# Patient Record
Sex: Male | Born: 1955 | Race: White | Hispanic: No
Health system: Southern US, Community
[De-identification: ages and names within clinical notes are randomized; demographics above are authoritative.]

---

## 2018-10-29 ENCOUNTER — Other Ambulatory Visit: Payer: Self-pay

## 2018-10-29 ENCOUNTER — Inpatient Hospital Stay (HOSPITAL_COMMUNITY)
Admission: AD | Admit: 2018-10-29 | Discharge: 2018-11-03 | DRG: 064 | Disposition: A | Discharge status: Home Health | Payer: Self-pay | Source: Other Acute Inpatient Hospital | Attending: Pulmonary Disease | Admitting: Pulmonary Disease

## 2018-10-29 ENCOUNTER — Inpatient Hospital Stay (HOSPITAL_COMMUNITY): Payer: Self-pay

## 2018-10-29 ENCOUNTER — Encounter (HOSPITAL_COMMUNITY): Payer: Self-pay | Admitting: Pulmonary Disease

## 2018-10-29 DIAGNOSIS — R270 Ataxia, unspecified: Secondary | ICD-10-CM | POA: Diagnosis present

## 2018-10-29 DIAGNOSIS — R740 Nonspecific elevation of levels of transaminase and lactic acid dehydrogenase [LDH]: Secondary | ICD-10-CM | POA: Diagnosis present

## 2018-10-29 DIAGNOSIS — X31XXXA Exposure to excessive natural cold, initial encounter: Secondary | ICD-10-CM

## 2018-10-29 DIAGNOSIS — R6521 Severe sepsis with septic shock: Secondary | ICD-10-CM | POA: Diagnosis present

## 2018-10-29 DIAGNOSIS — N179 Acute kidney failure, unspecified: Secondary | ICD-10-CM | POA: Diagnosis present

## 2018-10-29 DIAGNOSIS — I639 Cerebral infarction, unspecified: Secondary | ICD-10-CM | POA: Diagnosis present

## 2018-10-29 DIAGNOSIS — A419 Sepsis, unspecified organism: Secondary | ICD-10-CM | POA: Diagnosis present

## 2018-10-29 DIAGNOSIS — J189 Pneumonia, unspecified organism: Secondary | ICD-10-CM | POA: Diagnosis present

## 2018-10-29 DIAGNOSIS — G9349 Other encephalopathy: Secondary | ICD-10-CM | POA: Diagnosis present

## 2018-10-29 DIAGNOSIS — I6340 Cerebral infarction due to embolism of unspecified cerebral artery: Principal | ICD-10-CM | POA: Diagnosis present

## 2018-10-29 DIAGNOSIS — E43 Unspecified severe protein-calorie malnutrition: Secondary | ICD-10-CM

## 2018-10-29 DIAGNOSIS — J9601 Acute respiratory failure with hypoxia: Secondary | ICD-10-CM | POA: Diagnosis present

## 2018-10-29 DIAGNOSIS — E872 Acidosis: Secondary | ICD-10-CM | POA: Diagnosis present

## 2018-10-29 DIAGNOSIS — I63413 Cerebral infarction due to embolism of bilateral middle cerebral arteries: Secondary | ICD-10-CM

## 2018-10-29 DIAGNOSIS — T68XXXA Hypothermia, initial encounter: Secondary | ICD-10-CM | POA: Diagnosis present

## 2018-10-29 DIAGNOSIS — F1410 Cocaine abuse, uncomplicated: Secondary | ICD-10-CM | POA: Diagnosis present

## 2018-10-29 DIAGNOSIS — I4891 Unspecified atrial fibrillation: Secondary | ICD-10-CM | POA: Diagnosis present

## 2018-10-29 DIAGNOSIS — R569 Unspecified convulsions: Secondary | ICD-10-CM | POA: Diagnosis present

## 2018-10-29 LAB — RAPID URINE DRUG SCREEN, HOSP PERFORMED
Amphetamines: NOT DETECTED
BENZODIAZEPINES: POSITIVE — AB
Barbiturates: NOT DETECTED
COCAINE: POSITIVE — AB
Opiates: NOT DETECTED
Tetrahydrocannabinol: NOT DETECTED

## 2018-10-29 LAB — CBC WITH DIFFERENTIAL/PLATELET
BASOS ABS: 0 10*3/uL (ref 0.0–0.1)
Basophils Relative: 0 %
EOS ABS: 0 10*3/uL (ref 0.0–0.5)
EOS PCT: 0 %
HCT: 46.6 % (ref 39.0–52.0)
Hemoglobin: 14.2 g/dL (ref 13.0–17.0)
Lymphocytes Relative: 3 %
Lymphs Abs: 0.9 10*3/uL (ref 0.7–4.0)
MCH: 30.1 pg (ref 26.0–34.0)
MCHC: 30.5 g/dL (ref 30.0–36.0)
MCV: 98.9 fL (ref 80.0–100.0)
Monocytes Absolute: 2 10*3/uL — ABNORMAL HIGH (ref 0.1–1.0)
Monocytes Relative: 6 %
NRBC: 0 % (ref 0.0–0.2)
Neutro Abs: 27.8 10*3/uL — ABNORMAL HIGH (ref 1.7–7.7)
Neutrophils Relative %: 90 %
Platelets: 331 10*3/uL (ref 150–400)
RBC: 4.71 MIL/uL (ref 4.22–5.81)
RDW: 13.1 % (ref 11.5–15.5)
WBC: 31 10*3/uL — AB (ref 4.0–10.5)
nRBC: 0 /100 WBC

## 2018-10-29 LAB — COMPREHENSIVE METABOLIC PANEL
ALBUMIN: 3 g/dL — AB (ref 3.5–5.0)
ALT: 62 U/L — ABNORMAL HIGH (ref 0–44)
ANION GAP: 7 (ref 5–15)
AST: 163 U/L — ABNORMAL HIGH (ref 15–41)
Alkaline Phosphatase: 70 U/L (ref 38–126)
BILIRUBIN TOTAL: 0.6 mg/dL (ref 0.3–1.2)
BUN: 17 mg/dL (ref 8–23)
CO2: 22 mmol/L (ref 22–32)
Calcium: 7.5 mg/dL — ABNORMAL LOW (ref 8.9–10.3)
Chloride: 113 mmol/L — ABNORMAL HIGH (ref 98–111)
Creatinine, Ser: 1.35 mg/dL — ABNORMAL HIGH (ref 0.61–1.24)
GFR calc non Af Amer: 54 mL/min — ABNORMAL LOW (ref >60)
Glucose, Bld: 98 mg/dL (ref 70–99)
Potassium: 3.8 mmol/L (ref 3.5–5.1)
SODIUM: 142 mmol/L (ref 135–145)
Total Protein: 6.3 g/dL — ABNORMAL LOW (ref 6.5–8.1)

## 2018-10-29 LAB — GLUCOSE, CAPILLARY
GLUCOSE-CAPILLARY: 82 mg/dL (ref 70–99)
GLUCOSE-CAPILLARY: 77 mg/dL (ref 70–99)
Glucose-Capillary: 64 mg/dL — ABNORMAL LOW (ref 70–99)
Glucose-Capillary: 113 mg/dL — ABNORMAL HIGH (ref 70–99)
Glucose-Capillary: 106 mg/dL — ABNORMAL HIGH (ref 70–99)

## 2018-10-29 LAB — POCT I-STAT 3, ART BLOOD GAS (G3+)
Acid-base deficit: 9 mmol/L — ABNORMAL HIGH (ref 0.0–2.0)
Bicarbonate: 17.2 mmol/L — ABNORMAL LOW (ref 20.0–28.0)
O2 Saturation: 97 %
PCO2 ART: 37.9 mmHg (ref 32.0–48.0)
TCO2: 18 mmol/L — ABNORMAL LOW (ref 22–32)
pH, Arterial: 7.262 — ABNORMAL LOW (ref 7.350–7.450)
pO2, Arterial: 99 mmHg (ref 83.0–108.0)

## 2018-10-29 LAB — MRSA PCR SCREENING: MRSA BY PCR: POSITIVE — AB

## 2018-10-29 LAB — LACTIC ACID, PLASMA: LACTIC ACID, VENOUS: 3 mmol/L — AB (ref 0.5–1.9)

## 2018-10-29 LAB — MAGNESIUM: MAGNESIUM: 1.6 mg/dL — AB (ref 1.7–2.4)

## 2018-10-29 LAB — PHOSPHORUS: Phosphorus: 1.7 mg/dL — ABNORMAL LOW (ref 2.5–4.6)

## 2018-10-29 LAB — CK: Total CK: 6276 U/L — ABNORMAL HIGH (ref 49–397)

## 2018-10-29 MED ORDER — FENTANYL CITRATE (PF) 100 MCG/2ML IJ SOLN
100.0000 ug | INTRAMUSCULAR | Status: DC | PRN
  Administered 2018-10-30 – 2018-10-31 (×7): 100 ug via INTRAVENOUS
  Filled 2018-10-29 (×7): qty 2

## 2018-10-29 MED ORDER — SODIUM CHLORIDE 0.9 % IV SOLN
2.0000 g | INTRAVENOUS | Status: DC
  Administered 2018-10-29 – 2018-10-30 (×2): 2 g via INTRAVENOUS
  Filled 2018-10-29 (×3): qty 20

## 2018-10-29 MED ORDER — IOPAMIDOL (ISOVUE-370) INJECTION 76%
50.0000 mL | Freq: Once | INTRAVENOUS | Status: AC | PRN
  Administered 2018-10-29: 50 mL via INTRAVENOUS

## 2018-10-29 MED ORDER — SODIUM CHLORIDE 0.9 % IV SOLN
1.0000 mg | Freq: Every day | INTRAVENOUS | Status: DC
  Administered 2018-10-29 – 2018-10-30 (×2): 1 mg via INTRAVENOUS
  Filled 2018-10-29 (×4): qty 0.2

## 2018-10-29 MED ORDER — DOCUSATE SODIUM 50 MG/5ML PO LIQD: 100.0000 mg | Freq: Two times a day (BID) | ORAL | Status: DC | PRN

## 2018-10-29 MED ORDER — NOREPINEPHRINE 4 MG/250ML-% IV SOLN
5.0000 ug/min | INTRAVENOUS | Status: DC
  Administered 2018-10-29: 5 ug/min via INTRAVENOUS
  Administered 2018-10-29: 9 ug/min via INTRAVENOUS
  Administered 2018-10-30: 5 ug/min via INTRAVENOUS
  Administered 2018-10-31: 1.5 ug/min via INTRAVENOUS
  Filled 2018-10-29 (×4): qty 250

## 2018-10-29 MED ORDER — FAMOTIDINE IN NACL 20-0.9 MG/50ML-% IV SOLN
20.0000 mg | Freq: Two times a day (BID) | INTRAVENOUS | Status: DC
  Administered 2018-10-29 – 2018-10-30 (×3): 20 mg via INTRAVENOUS
  Filled 2018-10-29 (×3): qty 50

## 2018-10-29 MED ORDER — MIDAZOLAM HCL 2 MG/2ML IJ SOLN
2.0000 mg | INTRAMUSCULAR | Status: DC | PRN
  Administered 2018-10-31: 2 mg via INTRAVENOUS
  Filled 2018-10-29: qty 2

## 2018-10-29 MED ORDER — MIDAZOLAM HCL 2 MG/2ML IJ SOLN
2.0000 mg | INTRAMUSCULAR | Status: DC | PRN
  Administered 2018-10-29 – 2018-10-31 (×5): 2 mg via INTRAVENOUS
  Filled 2018-10-29 (×5): qty 2

## 2018-10-29 MED ORDER — THIAMINE HCL 100 MG/ML IJ SOLN
100.0000 mg | Freq: Every day | INTRAMUSCULAR | Status: AC
  Administered 2018-10-29 – 2018-10-31 (×3): 100 mg via INTRAVENOUS
  Filled 2018-10-29 (×3): qty 2

## 2018-10-29 MED ORDER — ORAL CARE MOUTH RINSE
15.0000 mL | OROMUCOSAL | Status: DC
  Administered 2018-10-29 – 2018-10-31 (×17): 15 mL via OROMUCOSAL

## 2018-10-29 MED ORDER — SODIUM CHLORIDE 0.9 % IV SOLN
250.0000 mL | INTRAVENOUS | Status: DC
  Administered 2018-10-29: 250 mL via INTRAVENOUS

## 2018-10-29 MED ORDER — IPRATROPIUM-ALBUTEROL 0.5-2.5 (3) MG/3ML IN SOLN
3.0000 mL | Freq: Four times a day (QID) | RESPIRATORY_TRACT | Status: DC
  Administered 2018-10-29 – 2018-10-31 (×10): 3 mL via RESPIRATORY_TRACT
  Filled 2018-10-29 (×9): qty 3

## 2018-10-29 MED ORDER — DEXTROSE 50 % IV SOLN
25.0000 mL | Freq: Once | INTRAVENOUS | Status: AC
  Administered 2018-10-29: 25 mL via INTRAVENOUS
  Filled 2018-10-29: qty 50

## 2018-10-29 MED ORDER — INSULIN ASPART 100 UNIT/ML ~~LOC~~ SOLN
0.0000 [IU] | SUBCUTANEOUS | Status: DC
  Administered 2018-11-01 – 2018-11-02 (×2): 1 [IU] via SUBCUTANEOUS

## 2018-10-29 MED ORDER — DEXMEDETOMIDINE HCL IN NACL 200 MCG/50ML IV SOLN
0.0000 ug/kg/h | INTRAVENOUS | Status: DC
  Administered 2018-10-29 (×3): 0.4 ug/kg/h via INTRAVENOUS
  Administered 2018-10-30: 0.6 ug/kg/h via INTRAVENOUS
  Administered 2018-10-30: 0.602 ug/kg/h via INTRAVENOUS
  Administered 2018-10-30 – 2018-10-31 (×2): 0.6 ug/kg/h via INTRAVENOUS
  Administered 2018-10-31: 0.7 ug/kg/h via INTRAVENOUS
  Filled 2018-10-29 (×8): qty 50

## 2018-10-29 MED ORDER — FENTANYL CITRATE (PF) 100 MCG/2ML IJ SOLN: 100.0000 ug | INTRAMUSCULAR | Status: DC | PRN

## 2018-10-29 MED ORDER — CHLORHEXIDINE GLUCONATE 0.12% ORAL RINSE (MEDLINE KIT)
15.0000 mL | Freq: Two times a day (BID) | OROMUCOSAL | Status: DC
  Administered 2018-10-29 – 2018-11-02 (×9): 15 mL via OROMUCOSAL

## 2018-10-29 MED ORDER — ONDANSETRON HCL 4 MG/2ML IJ SOLN: 4.0000 mg | Freq: Four times a day (QID) | INTRAMUSCULAR | Status: DC | PRN

## 2018-10-29 NOTE — Progress Notes (Signed)
Spoke with pt's brotherSuella Broad- Lannie Blough. Brother reports pt is divorced and has a grown daughter that he has no contact with/no way to get in touch with. Brothers nu [phone].

## 2018-10-29 NOTE — H&P (Signed)
NAME:  Cory Golden, MRN:  [id number], DOB:  02/09/1956, LOS: 0 ADMISSION DATE:  10/29/2018, CONSULTATION DATE:  10/29/2018 REFERRING MD:  Cory Golden hospital EDP, CHIEF COMPLAINT:  Found down   Brief History   62 year old male found down at home on October 29, 2018, hypothermic, found to have evidence of an acute right cerebellar stroke.  History of present illness   62 year old male with a history of cocaine abuse was found down around 2 AM on October 29, 2018 in his RV.  His core temperature by EMS was 85 degrees and there was concern for seizure activity.  [facility] was [facility] and intubated for airway protection.  There he received warm saline, warm blankets.  His EKG showed evidence of atrial fibrillation, a chest x-ray showed an infiltrate in the left lung, and a head CT showed an acute to subacute right cerebellar stroke.  No family was available to provide further history.  He required levo fed to maintain a normal blood pressure, he received more than 5 L of saline at [facility].  He was transferred to our facility for further management.  Upon arrival here he was sedated on the ventilator and could not provide a history so history was obtained by chart review.  Past Medical History  Cocaine abuse  Significant Hospital Events     Consults:  November 17 neurology  Procedures:  November 17 endotracheal tube November 17 right internal jugular central venous catheter [facility] EDP placed)  Significant Diagnostic Tests:  November 17 CT head showed acute to subacute right cerebellar stroke   Micro Data:  November 17 respiratory culture  Antimicrobials:  November 17 ceftriaxone  Interim history/subjective:  As above  Objective   Height 6' (1.829 m), SpO2 100 %.    Vent Mode: PRVC FiO2 (%):  [30 %] 30 % Set Rate:  [26 bmp] 26 bmp Vt Set:  [520 mL] 520 mL PEEP:  [5 cmH20] 5 cmH20 Plateau Pressure:  [14 cmH20] 14 cmH20   Intake/Output Summary  (Last 24 hours) at 10/29/2018 1034 Last data filed at 10/29/2018 1005 Gross per 24 hour  Intake -  Output 1300 ml  Net -1300 ml   There were no vitals filed for this visit.  Examination:  General:  In bed on vent HENT: NCAT ETT in place PULM: CTA B, vent supported breathing CV: RRR, no mgr GI: BS+, soft, nontender MSK: normal bulk and tone Neuro: sedated on vent  Labs from [facility] reviewed: Kidney functions normal, anion gap is normal, profound respiratory acidosis with pH of 6.9, lactic acid greater than 5, urinalysis with RBCs, otherwise unremarkable.  AST slightly elevated at 62.  Resolved Hospital Problem list     Assessment & Plan:  62 year old male with a past medical history significant for cocaine abuse now here with acute to subacute right cerebellar infarct, atrial fibrillation, acute respiratory failure with hypoxemia and hypothermia.  Acute encephalopathy in setting of acute cerebellar stroke: Thiamine Folate Neurology consult Repeat head imaging (MRI) per neurology Hold off on anticoagulation until discussed with neurology  Question seizure activity at outside hospital Repeat EEG Neurology consult Received Keppra, defer further dosing to neurology  Shock: hypothermia, resolving Continue Levophed titrated to maintain a mean arterial pressure greater than 65 Repeat lactic acid Central venous pressure monitor  Atrial fibrillation: Unclear if new onset Telemetry monitoring Hold beta-blocker given history of cocaine abuse Check urine drug screen Hold anticoagulation until imaging findings discussed with neurology with acute stroke  Acute respiratory failure with hypoxemia due to inability to protect airway Full mechanical ventilatory support Ventilator associated pneumonia prevention protocol Daily wake-up assessment/spontaneous breathing trial  Aspiration pneumonia: Restored culture Ceftriaxone  Elevated AST: Likely alcohol  hepatitis? Repeat comprehensive metabolic panel  Hypothermia: Resolving Monitor temperature here in ICU  Cocaine abuse: Urine drug screen    Best practice:  Diet: tube feeding per dietary Pain/Anxiety/Delirium protocol (if indicated): PAD protocol, RASS goal 0  To -1 VAP protocol (if indicated): yes DVT prophylaxis: SCD GI prophylaxis: famotidicine Glucose control: SSI Mobility: bed rest Code Status: full Family Communication: none bedside Disposition:   Labs   CBC: No results for input(s): WBC, NEUTROABS, HGB, HCT, MCV, PLT in the last 168 hours.  Basic Metabolic Panel: No results for input(s): NA, K, CL, CO2, GLUCOSE, BUN, CREATININE, CALCIUM, MG, PHOS in the last 168 hours. GFR: CrCl cannot be calculated (No successful lab value found.). No results for input(s): PROCALCITON, WBC, LATICACIDVEN in the last 168 hours.  Liver Function Tests: No results for input(s): AST, ALT, ALKPHOS, BILITOT, PROT, ALBUMIN in the last 168 hours. No results for input(s): LIPASE, AMYLASE in the last 168 hours. No results for input(s): AMMONIA in the last 168 hours.  ABG    Component Value Date/Time   PHART 7.262 (L) 10/29/2018 1017   PCO2ART 37.9 10/29/2018 1017   PO2ART 99.0 10/29/2018 1017   HCO3 17.2 (L) 10/29/2018 1017   TCO2 18 (L) 10/29/2018 1017   ACIDBASEDEF 9.0 (H) 10/29/2018 1017   O2SAT 97.0 10/29/2018 1017     Coagulation Profile: No results for input(s): INR, PROTIME in the last 168 hours.  Cardiac Enzymes: No results for input(s): CKTOTAL, CKMB, CKMBINDEX, TROPONINI in the last 168 hours.  HbA1C: No results found for: HGBA1C  CBG: No results for input(s): GLUCAP in the last 168 hours.  Review of Systems:   Cannot obtain due to intubation   Past Medical History  Cannot obtain due to intubation   Surgical History   Cannot obtain due to intubation   Social History    Cannot obtain due to intubation   Family History   Cannot obtain due to intubation    Allergies Allergies no known allergies   Home Medications  Prior to Admission medications   Not on File     Critical care time: 45 minutes     Heber Hartford, MD [facility] Pager: [phone] Cell: [phone] If no response, call [phone]

## 2018-10-29 NOTE — Procedures (Signed)
  [place] A. Merlene Laughter, MD     [email or web address]           HISTORY: This is a 10 who presents with AMS. The study is being done to evaluate for SZ.   MEDICATIONS: Scheduled Meds: . chlorhexidine gluconate (MEDLINE KIT)  15 mL Mouth Rinse BID  . insulin aspart  0-9 Units Subcutaneous Q4H  . ipratropium-albuterol  3 mL Nebulization Q6H  . mouth rinse  15 mL Mouth Rinse 10 times per day  . thiamine  100 mg Intravenous Daily   Continuous Infusions: . sodium chloride Stopped (10/29/18 1146)  . cefTRIAXone (ROCEPHIN)  IV Stopped (10/29/18 1212)  . dexmedetomidine (PRECEDEX) IV infusion 0.4 mcg/kg/hr (10/29/18 1300)  . famotidine (PEPCID) IV    . folic acid (FOLVITE) IVPB 100 mL/hr at 10/29/18 1300  . norepinephrine (LEVOPHED) Adult infusion 5 mcg/min (10/29/18 1300)   PRN Meds:.docusate, fentaNYL (SUBLIMAZE) injection, fentaNYL (SUBLIMAZE) injection, midazolam, midazolam, ondansetron (ZOFRAN) IV  Prior to Admission medications   Not on File      ANALYSIS: A 16 channel recording using standard 10 20 measurements is conducted for 22 minutes.  There is a dominant low voltage posterior rhythm of 14-15 Hz which attenuates with eye opening. There is beta activity seen over the frontal fields. Awake and drowsy sleep activities are seen. Photic stimulation and hyperventilation were not conducted. There is no focal slowing, lateralized slowing or epileptiform activity.   IMPRESSION: 1. This is a normal of the awake and drowsy states.       Parrish Daddario A. Merlene Laughter, M.D.  Diplomate, Tax adviser of Psychiatry and Neurology ( Neurology).

## 2018-10-29 NOTE — Progress Notes (Signed)
Wasted 12 mg versed with My Edwyna ReadyKosterman, RN

## 2018-10-29 NOTE — Progress Notes (Signed)
CRITICAL VALUE STICKER  CRITICAL VALUE: Lactic Acid 3.0  RECEIVER (on-site recipient of call): Cory Golden  DATE & TIME NOTIFIED: 10/29/18 1130 MESSENGER (representative from lab):  MD NOTIFIED: Heber CarolinaBrent Mcquaid MD CCM  TIME OF NOTIFICATION: 1205  RESPONSE:  MD notified, no new orders at this time

## 2018-10-29 NOTE — Progress Notes (Signed)
EEG completed, results pending. 

## 2018-10-29 NOTE — Progress Notes (Signed)
[facility] attending  I discussed the finding of multiple small strokes on the CT angiogram with neurology, this is suggestive of an embolic phenomena.  Plan blood culture, hold aspirin, will eventually need MRI.  Neurology will discuss MRI logistics with the MRI service as apparently they have been unable to verify whether or not the patient has any metal in his body.  Lactic acid has improved compared to outside hospital value, no need to repeat  Heber CarolinaBrent Kippy Gohman, MD [facility] Pager: [phone] Cell: [phone] If no response, call [phone]

## 2018-10-29 NOTE — Consult Note (Addendum)
NEURO HOSPITALIST CONSULT NOTE   Requestig physician: Dr. Lake Bells   Reason for Consult:stroke/found down/seizure?   History obtained from:  Chart review  HPI:                                                                                                                                          Cory Golden is an 62 y.o. male With history of cocaine abuse found down 10/29/18 around 2 am.He was found in his RV. Per EMS his core temperature was 85 degrees and there was concern for seizure activity. He was initially taken to [facility] where he was intubated for airway protection.  He was warmed with warm saline and blankets.   Hospital course: [facility] ED: intubated, EKG: showed a. Fib, chest x-ray showed left lung infiltrate. CTH:  Subacute/acute right cerebellar stroke. LEVO for blood pressure maintenance. 5L saline. PT: 12.6, WBC: 16.8 Hgb: 12.9,hct:40.3,BG:50, K: 3.1,  Na: WNL, UDS: + cocaine Transferred to [facility].  History reviewed. No pertinent past medical history. Unable to verify, patient not awake.   History reviewed. No pertinent surgical history. Unable to verify, patient not awake.    History reviewed. No pertinent family history.     Unable to verify, patient not awake.       Social History:  has no tobacco, alcohol, and drug history on file. History of cocaine abuse.   Allergies no known allergies  MEDICATIONS:                                                                                                                     Scheduled: . chlorhexidine gluconate (MEDLINE KIT)  15 mL Mouth Rinse BID  . insulin aspart  0-9 Units Subcutaneous Q4H  . ipratropium-albuterol  3 mL Nebulization Q6H  . mouth rinse  15 mL Mouth Rinse 10 times per day  . thiamine  100 mg Intravenous Daily   Continuous: . sodium chloride 250 mL (10/29/18 1132)  . cefTRIAXone (ROCEPHIN)  IV    . dexmedetomidine (PRECEDEX) IV infusion 0.4 mcg/kg/hr (10/29/18 1118)  .  famotidine (PEPCID) IV    . folic acid (FOLVITE) IVPB    . norepinephrine (LEVOPHED) Adult infusion 5 mcg/min (10/29/18 1120)   ZOX:WRUEAVWU, fentaNYL (SUBLIMAZE) injection, fentaNYL (SUBLIMAZE) injection, midazolam, midazolam,  ondansetron (ZOFRAN) IV   ROS:                                                                                                                                       History obtained from chart review   Blood pressure 120/82, pulse 83, temperature 97.7 F (36.5 C), temperature source Core, resp. rate (!) 26, height 6' (1.829 m), weight 63.8 kg, SpO2 100 %.   General Examination:                                                                                                       Physical Exam  HEENT-  Normocephalic, no lesions, without obvious abnormality.  Normal external eye and conjunctiva.   Cardiovascular- S1-S2 audible, pulses palpable throughout   Lungs-no rhonchi or wheezing noted, no excessive working breathing.  Saturations within normal limits intubated. Abdomen- All 4 quadrants palpated and nontender Extremities- Warm, dry and intact Musculoskeletal-no joint tenderness, deformity or swelling Skin-warm and dry, no hyperpigmentation, vitiligo, or suspicious lesions  Neurological Examination patient intubated not sedated: Mental Status: Patient intubated not sedated. On pressors for BP maintenance. Arouses to sternal rub. Does not follow commands Cranial Nerves: Cough/gag intact. Pinpoint pupils, scleral edema present. Does not blink to threat. Tongue appears midline Face appear symmetric in the presence of ETT. Eyes are midline. Motor/sensory: Patient moved all 4 extremities to noxious stimuli. Patient moves right side more than left side. LUE and LLE 1/5 and  RUE 4/5 and RLE 3/5 Tone and bulk:normal tone throughout; no atrophy noted Deep Tendon Reflexes: 2+ and symmetric biceps, patella Plantars: Right: downgoing   Left:  downgoing Cerebellar: UTA Gait: deferred   Lab Results: Basic Metabolic Panel: Recent Labs  Lab 10/29/18 1023  NA 142  K 3.8  CL 113*  CO2 22  GLUCOSE 98  BUN 17  CREATININE 1.35*  CALCIUM 7.5*  MG 1.6*  PHOS 1.7*    CBC: Recent Labs  Lab 10/29/18 1023  WBC 31.0*  NEUTROABS PENDING  HGB 14.2  HCT 46.6  MCV 98.9  PLT 331    Cardiac Enzymes: Recent Labs  Lab 10/29/18 [place], MSN, NP-C [facility] [phone]  Attending neurologist's note to follow   Imaging CT Head and CTA head and neck  NEUROHOSPITALIST ADDENDUM Performed a face to face diagnostic evaluation.   I have reviewed the contents of history and physical exam as documented by PA/ARNP/Resident and agree with  above documentation.  I have discussed and formulated the plan as documented below . Edits to the note have been made as needed.       ASSESSMENT AND PLAN  62 year old male with a history of cocaine abuse. Found down and unresponsive at 2 am.He was found in his RV. Per EMS his core temperature was 85 degrees and there was concern for seizure activity.  Dr. [employer] and intubated for protection.  CTA showed a small cerebellar infarct.  Patient was transferred to [facility] for further evaluation.  On arrival to [facility] ICU, patient no longer having seizure activity.  Sedation being weaned.  Patient remains hypotensive requiring pressors.  On assessment patient appears to localize on the right side labs upper lower extremity appears to be much weaker and does not withdraw to noxious stimulus.  We repeated a CT head  EEG: normal  MRI unable to be obtained at this time d/t patient needing 2 drips and MRI only having 1 pump. CTH/CTA head and neck: Multiple embolic infarcts infratentorial and supratentorial over bilateral hemispheres.  CTA showed no large vessel occlusion, basilar artery appears patent   Impression: Acute  CVA Seizures  Recommendations: # MRI of the brain without contrast #Transthoracic Echo # Blood cultures # No ASA until Echo, blood cx negative  due to concern for endocarditis #Start or continue Atorvastatin 40 mg/other high intensity statin # BP goal: permissive HTN ( currently requires pressors)  # HBAIC and Lipid profile # Telemetry monitoring # Frequent neuro checks #  stroke swallow screen # continue Keppra   Please page stroke NP  Or  PA  Or MD from 8am -4 pm  as this patient from this time will be  followed by the stroke.   You can look them up on [email or web address]  Password [email or web address]   10/29/2018, 11:27 AM      Karena Addison Aroor MD [facility] [phone]   If 7pm to 7am, please call on call as listed on AMION.

## 2018-10-29 NOTE — Progress Notes (Signed)
The patient was transported to CT and back without incident. The patient is resting on his ordered vent settings at this time.

## 2018-10-30 ENCOUNTER — Inpatient Hospital Stay (HOSPITAL_COMMUNITY): Payer: Self-pay

## 2018-10-30 ENCOUNTER — Other Ambulatory Visit (HOSPITAL_COMMUNITY): Payer: Self-pay

## 2018-10-30 DIAGNOSIS — A419 Sepsis, unspecified organism: Secondary | ICD-10-CM

## 2018-10-30 DIAGNOSIS — I6340 Cerebral infarction due to embolism of unspecified cerebral artery: Principal | ICD-10-CM

## 2018-10-30 DIAGNOSIS — R6521 Severe sepsis with septic shock: Secondary | ICD-10-CM

## 2018-10-30 DIAGNOSIS — I9589 Other hypotension: Secondary | ICD-10-CM

## 2018-10-30 DIAGNOSIS — I480 Paroxysmal atrial fibrillation: Secondary | ICD-10-CM

## 2018-10-30 DIAGNOSIS — J9621 Acute and chronic respiratory failure with hypoxia: Secondary | ICD-10-CM

## 2018-10-30 LAB — BASIC METABOLIC PANEL
Anion gap: 3 — ABNORMAL LOW (ref 5–15)
BUN: 14 mg/dL (ref 8–23)
CHLORIDE: 112 mmol/L — AB (ref 98–111)
CO2: 27 mmol/L (ref 22–32)
CREATININE: 1.06 mg/dL (ref 0.61–1.24)
Calcium: 7.6 mg/dL — ABNORMAL LOW (ref 8.9–10.3)
GFR calc Af Amer: >60 mL/min (ref >60)
Glucose, Bld: 103 mg/dL — ABNORMAL HIGH (ref 70–99)
POTASSIUM: 3.6 mmol/L (ref 3.5–5.1)
SODIUM: 142 mmol/L (ref 135–145)

## 2018-10-30 LAB — PHOSPHORUS: PHOSPHORUS: 2.7 mg/dL (ref 2.5–4.6)

## 2018-10-30 LAB — CBC
HEMATOCRIT: 43.5 % (ref 39.0–52.0)
HEMOGLOBIN: 13.3 g/dL (ref 13.0–17.0)
MCH: 29.7 pg (ref 26.0–34.0)
MCHC: 30.6 g/dL (ref 30.0–36.0)
MCV: 97.1 fL (ref 80.0–100.0)
Platelets: 255 10*3/uL (ref 150–400)
RBC: 4.48 MIL/uL (ref 4.22–5.81)
RDW: 13.4 % (ref 11.5–15.5)
WBC: 20.4 10*3/uL — ABNORMAL HIGH (ref 4.0–10.5)
nRBC: 0 % (ref 0.0–0.2)

## 2018-10-30 LAB — GLUCOSE, CAPILLARY
GLUCOSE-CAPILLARY: 113 mg/dL — AB (ref 70–99)
GLUCOSE-CAPILLARY: 62 mg/dL — AB (ref 70–99)
GLUCOSE-CAPILLARY: 82 mg/dL (ref 70–99)
Glucose-Capillary: 82 mg/dL (ref 70–99)
Glucose-Capillary: 80 mg/dL (ref 70–99)
Glucose-Capillary: 91 mg/dL (ref 70–99)

## 2018-10-30 LAB — HIV ANTIBODY (ROUTINE TESTING W REFLEX): HIV Screen 4th Generation wRfx: NONREACTIVE

## 2018-10-30 LAB — MAGNESIUM: MAGNESIUM: 1.7 mg/dL (ref 1.7–2.4)

## 2018-10-30 MED ORDER — CHLORHEXIDINE GLUCONATE CLOTH 2 % EX PADS
6.0000 | MEDICATED_PAD | Freq: Every day | CUTANEOUS | Status: DC
  Administered 2018-10-30 – 2018-11-03 (×2): 6 via TOPICAL

## 2018-10-30 MED ORDER — DEXTROSE 50 % IV SOLN
INTRAVENOUS | Status: AC
  Administered 2018-10-30: 25 mL via INTRAVENOUS
  Filled 2018-10-30: qty 50

## 2018-10-30 MED ORDER — DEXTROSE 50 % IV SOLN
25.0000 mL | Freq: Once | INTRAVENOUS | Status: AC
  Administered 2018-10-30: 25 mL via INTRAVENOUS

## 2018-10-30 MED ORDER — MUPIROCIN 2 % EX OINT
1.0000 "application " | TOPICAL_OINTMENT | Freq: Two times a day (BID) | CUTANEOUS | Status: DC
  Administered 2018-10-30 – 2018-11-03 (×9): 1 via NASAL
  Filled 2018-10-30: qty 22

## 2018-10-30 NOTE — Progress Notes (Signed)
STROKE TEAM PROGRESS NOTE   SUBJECTIVE (INTERVAL HISTORY) His RN is at the bedside. Pt continues to have intermittent agitation, on precedex now. BP low and on pressor. Temp much improved, no more hypothermia. MRI pending.   OBJECTIVE Temp:  [97.5 F (36.4 C)-98.8 F (37.1 C)] 98.1 F (36.7 C) (11/18 1015) Pulse Rate:  [64-80] 74 (11/18 1125) Cardiac Rhythm: Normal sinus rhythm (11/18 0800) Resp:  [8-32] 32 (11/18 1125) BP: (70-135)/(52-80) 119/74 (11/18 1125) SpO2:  [100 %] 100 % (11/18 1125) FiO2 (%):  [30 %] 30 % (11/18 1125)  Recent Labs  Lab 10/29/18 2032 10/29/18 2329 10/30/18 0348 10/30/18 0804 10/30/18 1149  GLUCAP 113* 106* 82 80 91   Recent Labs  Lab 10/29/18 1023 10/30/18 0517  NA 142 142  K 3.8 3.6  CL 113* 112*  CO2 22 27  GLUCOSE 98 103*  BUN 17 14  CREATININE 1.35* 1.06  CALCIUM 7.5* 7.6*  MG 1.6* 1.7  PHOS 1.7* 2.7   Recent Labs  Lab 10/29/18 1023  AST 163*  ALT 62*  ALKPHOS 70  BILITOT 0.6  PROT 6.3*  ALBUMIN 3.0*   Recent Labs  Lab 10/29/18 1023 10/30/18 0517  WBC 31.0* 20.4*  NEUTROABS 27.8*  --   HGB 14.2 13.3  HCT 46.6 43.5  MCV 98.9 97.1  PLT 331 255   Recent Labs  Lab 10/29/18 1023  CKTOTAL 6,276*   No results for input(s): LABPROT, INR in the last 72 hours. No results for input(s): COLORURINE, LABSPEC, PHURINE, GLUCOSEU, HGBUR, BILIRUBINUR, KETONESUR, PROTEINUR, UROBILINOGEN, NITRITE, LEUKOCYTESUR in the last 72 hours.  Invalid input(s): APPERANCEUR  No results found for: CHOL, TRIG, HDL, CHOLHDL, VLDL, LDLCALC No results found for: HGBA1C    Component Value Date/Time   LABOPIA NONE DETECTED 10/29/2018 1204   COCAINSCRNUR POSITIVE (A) 10/29/2018 1204   LABBENZ POSITIVE (A) 10/29/2018 1204   AMPHETMU NONE DETECTED 10/29/2018 1204   THCU NONE DETECTED 10/29/2018 1204   LABBARB NONE DETECTED 10/29/2018 1204    No results for input(s): ETH in the last 168 hours.  I have personally reviewed the radiological images  below and agree with the radiology interpretations.  Ct Angio Head W Or Wo Contrast  Addendum Date: 10/29/2018   ADDENDUM REPORT: 10/29/2018 15:33 ADDENDUM: Study discussed by telephone with Dr. Kendrick Fries on 10/29/2018 at 1525 hours. Electronically Signed   By: Odessa Fleming M.D.   On: 10/29/2018 15:33   Result Date: 10/29/2018 CLINICAL DATA:  62 year old male found unresponsive. Transferred from [facility] with evidence of right cerebellar infarct on plain head CT 0520 hours today. EXAM: CT ANGIOGRAPHY HEAD AND NECK TECHNIQUE: Multidetector CT imaging of the head and neck was performed using the standard protocol during bolus administration of intravenous contrast. Multiplanar CT image reconstructions and MIPs were obtained to evaluate the vascular anatomy. Carotid stenosis measurements (when applicable) are obtained utilizing NASCET criteria, using the distal internal carotid diameter as the denominator. CONTRAST:  50mL ISOVUE-370 IOPAMIDOL (ISOVUE-370) INJECTION 76% COMPARISON:  [facility] head and cervical spine CT 0520 hours today. FINDINGS: CT HEAD Brain: Less motion artifact. Patchy and confluent hypodensity in the right cerebellum centrally and inferiorly has not significantly changed since 0520 hours today. No associated hemorrhage. There is some cerebellar vermis involvement (series 5, image 9). Mild mass effect on the 4th ventricle but other basilar cisterns are patent. No ventriculomegaly. There are scattered small foci of cytotoxic edema also in the right cerebral hemisphere including the frontal, parietal, and lateral right  occipital lobes (series 8, image 16) which are increased in conspicuity from earlier today. Questionable involvement also of the left parietal lobe. Age indeterminate patchy bilateral white matter hypodensity. No midline shift, mass effect, or evidence of intracranial mass lesion. Calvarium and skull base: No acute osseous abnormality identified. Paranasal sinuses:  Well pneumatized aside from right maxillary mucoperiosteal thickening. Orbits: No acute orbit or scalp soft tissue finding. CTA NECK Skeleton: Absent dentition. No acute osseous abnormality identified. Upper chest: Intubated. Endotracheal tube tip terminates about 3 centimeters above the carina in good position. Right nasoenteric tube courses into the esophagus. Centrilobular and paraseptal emphysema. Upper lungs are clear. No superior mediastinal lymphadenopathy. Other neck: Intubated, fluid in the pharynx. Right nasoenteric tube in place. Otherwise negative. Aortic arch: 3 vessel arch configuration with mild to moderate arch and great vessel origin atherosclerosis. No great vessel origin stenosis. Right carotid system: Negative. Left carotid system: Mild soft plaque in the left CCA. Minimal plaque at the posterior left ICA origin and bulb with no stenosis. Vertebral arteries: No proximal right subclavian artery stenosis despite mild plaque. Calcified plaque at the right vertebral artery origin with mild stenosis. The right vertebral appears non dominant but is patent to the skull base without stenosis. No proximal left subclavian artery stenosis despite plaque. Dominant left vertebral artery with bulky calcified plaque at its origin resulting in moderate stenosis (series 11, image 295). Patent left vertebral with no additional stenosis to the skull base. CTA HEAD Posterior circulation: Patent distal vertebral arteries, the left is dominant. The right functionally terminates in PICA, and the proximal right PICA appears normal. Patent left PICA origin. Normal vertebrobasilar junction. Patent basilar artery. Asymmetric enhancement of the a ICAs, decreased on the right. No basilar artery irregularity or stenosis. Normal SCA and right PCA origins. Fetal left PCA origin. Small right posterior communicating artery. Bilateral PCA branches are within normal limits. Anterior circulation: Both ICA siphons are patent. Mild  calcified plaque on the left with no stenosis. Normal left ophthalmic and posterior communicating artery origins. Mild-to-moderate calcified plaque on the right at the anterior genu with no significant stenosis. Normal ophthalmic and right posterior communicating artery origins. Patent carotid termini. Normal MCA and ACA origins. Anterior communicating artery and bilateral ACA branches are within normal limits. Median artery of the corpus callosum is present (normal variant). Left MCA M1, bifurcation, and left MCA branches are within normal limits. Right MCA M1, trifurcation, and right MCA branches are within normal limits. Venous sinuses: Patent on the delayed images. Anatomic variants: Dominant left vertebral artery, the right functionally terminates in PICA. Fetal left PCA origin. Median artery of the corpus callosum. Delayed phase: No abnormal enhancement identified. Review of the MIP images confirms the above findings IMPRESSION: 1. Negative for large vessel occlusion. Questionable Right AICA poor flow or occlusion. Right PICA is patent. 2. Positive for multifocal cerebellar AND cerebral infarcts in anterior and posterior vascular territories suggesting a recent Embolic Event from the heart or proximal aorta. 3. No associated hemorrhage. Stable cerebellar edema since this morning with 4th ventricular mass effect but no ventriculomegaly. 4. Moderate stenosis at the origin of the dominant left vertebral artery due to calcified plaque. No intracranial stenosis. Mild Cervical carotid atherosclerosis without stenosis. 5. Satisfactory placement of visible endotracheal and nasoenteric tubes. 6. Centrilobular and paraseptal Emphysema (ICD10-J43.9). Electronically Signed: By: Odessa FlemingH  Hall M.D. On: 10/29/2018 15:12   Ct Angio Neck W Or Wo Contrast  Addendum Date: 10/29/2018   ADDENDUM REPORT: 10/29/2018 15:33 ADDENDUM: Sarita BottomStudy  discussed by telephone with Dr. Kendrick Fries on 10/29/2018 at 1525 hours. Electronically Signed   By:  Odessa Fleming M.D.   On: 10/29/2018 15:33   Result Date: 10/29/2018 CLINICAL DATA:  62 year old male found unresponsive. Transferred from [facility] with evidence of right cerebellar infarct on plain head CT 0520 hours today. EXAM: CT ANGIOGRAPHY HEAD AND NECK TECHNIQUE: Multidetector CT imaging of the head and neck was performed using the standard protocol during bolus administration of intravenous contrast. Multiplanar CT image reconstructions and MIPs were obtained to evaluate the vascular anatomy. Carotid stenosis measurements (when applicable) are obtained utilizing NASCET criteria, using the distal internal carotid diameter as the denominator. CONTRAST:  50mL ISOVUE-370 IOPAMIDOL (ISOVUE-370) INJECTION 76% COMPARISON:  [facility] head and cervical spine CT 0520 hours today. FINDINGS: CT HEAD Brain: Less motion artifact. Patchy and confluent hypodensity in the right cerebellum centrally and inferiorly has not significantly changed since 0520 hours today. No associated hemorrhage. There is some cerebellar vermis involvement (series 5, image 9). Mild mass effect on the 4th ventricle but other basilar cisterns are patent. No ventriculomegaly. There are scattered small foci of cytotoxic edema also in the right cerebral hemisphere including the frontal, parietal, and lateral right occipital lobes (series 8, image 16) which are increased in conspicuity from earlier today. Questionable involvement also of the left parietal lobe. Age indeterminate patchy bilateral white matter hypodensity. No midline shift, mass effect, or evidence of intracranial mass lesion. Calvarium and skull base: No acute osseous abnormality identified. Paranasal sinuses: Well pneumatized aside from right maxillary mucoperiosteal thickening. Orbits: No acute orbit or scalp soft tissue finding. CTA NECK Skeleton: Absent dentition. No acute osseous abnormality identified. Upper chest: Intubated. Endotracheal tube tip terminates about 3  centimeters above the carina in good position. Right nasoenteric tube courses into the esophagus. Centrilobular and paraseptal emphysema. Upper lungs are clear. No superior mediastinal lymphadenopathy. Other neck: Intubated, fluid in the pharynx. Right nasoenteric tube in place. Otherwise negative. Aortic arch: 3 vessel arch configuration with mild to moderate arch and great vessel origin atherosclerosis. No great vessel origin stenosis. Right carotid system: Negative. Left carotid system: Mild soft plaque in the left CCA. Minimal plaque at the posterior left ICA origin and bulb with no stenosis. Vertebral arteries: No proximal right subclavian artery stenosis despite mild plaque. Calcified plaque at the right vertebral artery origin with mild stenosis. The right vertebral appears non dominant but is patent to the skull base without stenosis. No proximal left subclavian artery stenosis despite plaque. Dominant left vertebral artery with bulky calcified plaque at its origin resulting in moderate stenosis (series 11, image 295). Patent left vertebral with no additional stenosis to the skull base. CTA HEAD Posterior circulation: Patent distal vertebral arteries, the left is dominant. The right functionally terminates in PICA, and the proximal right PICA appears normal. Patent left PICA origin. Normal vertebrobasilar junction. Patent basilar artery. Asymmetric enhancement of the a ICAs, decreased on the right. No basilar artery irregularity or stenosis. Normal SCA and right PCA origins. Fetal left PCA origin. Small right posterior communicating artery. Bilateral PCA branches are within normal limits. Anterior circulation: Both ICA siphons are patent. Mild calcified plaque on the left with no stenosis. Normal left ophthalmic and posterior communicating artery origins. Mild-to-moderate calcified plaque on the right at the anterior genu with no significant stenosis. Normal ophthalmic and right posterior communicating  artery origins. Patent carotid termini. Normal MCA and ACA origins. Anterior communicating artery and bilateral ACA branches are within normal limits. Median  artery of the corpus callosum is present (normal variant). Left MCA M1, bifurcation, and left MCA branches are within normal limits. Right MCA M1, trifurcation, and right MCA branches are within normal limits. Venous sinuses: Patent on the delayed images. Anatomic variants: Dominant left vertebral artery, the right functionally terminates in PICA. Fetal left PCA origin. Median artery of the corpus callosum. Delayed phase: No abnormal enhancement identified. Review of the MIP images confirms the above findings IMPRESSION: 1. Negative for large vessel occlusion. Questionable Right AICA poor flow or occlusion. Right PICA is patent. 2. Positive for multifocal cerebellar AND cerebral infarcts in anterior and posterior vascular territories suggesting a recent Embolic Event from the heart or proximal aorta. 3. No associated hemorrhage. Stable cerebellar edema since this morning with 4th ventricular mass effect but no ventriculomegaly. 4. Moderate stenosis at the origin of the dominant left vertebral artery due to calcified plaque. No intracranial stenosis. Mild Cervical carotid atherosclerosis without stenosis. 5. Satisfactory placement of visible endotracheal and nasoenteric tubes. 6. Centrilobular and paraseptal Emphysema (ICD10-J43.9). Electronically Signed: By: Odessa Fleming M.D. On: 10/29/2018 15:12   Dg Chest Port 1 View  Result Date: 10/30/2018 CLINICAL DATA:  Acute respiratory failure with hypoxemia EXAM: PORTABLE CHEST 1 VIEW COMPARISON:  10/29/2018 FINDINGS: Endotracheal tube in good position. Right jugular central venous catheter tip in the mid SVC unchanged. NG tube in place. COPD with pulmonary hyperinflation. Progression of mild bibasilar airspace disease. No effusion. IMPRESSION: Support lines remain in good position and unchanged COPD with progression  of bibasilar atelectasis/infiltrate. Electronically Signed   By: Marlan Palau M.D.   On: 10/30/2018 08:16     PHYSICAL EXAM  Temp:  [97.5 F (36.4 C)-98.8 F (37.1 C)] 98.1 F (36.7 C) (11/18 1015) Pulse Rate:  [64-80] 74 (11/18 1125) Resp:  [8-32] 32 (11/18 1125) BP: (70-135)/(52-80) 119/74 (11/18 1125) SpO2:  [100 %] 100 % (11/18 1125) FiO2 (%):  [30 %] 30 % (11/18 1125)  General - cachectic, well developed, intubated.  Ophthalmologic - fundi not visualized due to noncooperation.  Cardiovascular - Regular rate and rhythm.  Neuro - intubated on precedex, eyes closed and against forced open. Intermittent agitation and tried to sit up from bed. In wrist restrains bilaterally. Not following commands. PERRL, eye mid position, not cooperative with doll's eyes. Positive corneal and gag and cough. Facial symmetry not able to test due to ET tube. Tongue protrusion not able to test. Moving BUEs equally at least 4/5, RLE more aggressive movement than left, RLE 3+/5 and LLE 2+/5. DTR 1+ and no babinski. Sensation, coordination and gait not tested.   ASSESSMENT/PLAN Mr. Guerin Lashomb is a 62 y.o. male with history of cocaine use admitted for found down with hypothermia and ? Seizure activity. No tPA given due to outside window.    Stroke:  Multifocal infarcts involving right cerebellar, b/l frontal and b/l MCA/PCA territory, embolic, secondary to unclear source, concerning for endocarditis  Resultant intubated and AMS  MRI  pending  CTA head and neck - unremarkable except left VA proximal stenosis, no mycotic aneurysm  2D Echo  pending  TEE likely to be needed to rule out endocarditis  EEG no seizure  LDL not checked yet  HgbA1c not checked yet  SCDs for VTE prophylaxis  No antithrombotic prior to admission, now on No antithrombotic given possible endocarditis.   Ongoing aggressive stroke risk factor management  Therapy recommendations: pending   Disposition:  Pending    Delirium and agitation  Trying to sit  up from bed  On restrain  On precedex  CCM on board  Sepsis with left lung infiltration, hypothermia, ? endocarditis  CCM on board  CXR showed left lung infiltration  On rocephin  Sputum and blood culture pending  Hypothermia resolved so far  May need TEE to evaluate endocarditis  ? afib   As per chart pt had afib on EKG  No clear evidence of afib on EKGs and at this time  Will continue to monitor  Hypotension . On levophed . Septic shock ??  Long term BP goal normotensive  Cocaine abuse   UDS positive for cocaine and benzo  Cessation education/consunseling will be provided later  Other Stroke Risk Factors  Advanced age  Other Active Problems  Leukocytosis - WBC 31.0->20.4  AKI - Cre 1.35->1.06  Hospital day # 1  This patient is critically ill due to AMS, multifocal strokes, sepsis, ? Afib, pneumonia, hypotension, cocaine use and at significant risk of neurological worsening, death form septic shock, seizure, recurrent stroke, hemorrhagic conversion, heart failure, cardiac arrest. This patient's care requires constant monitoring of vital signs, hemodynamics, respiratory and cardiac monitoring, review of multiple databases, neurological assessment, discussion with family, other specialists and medical decision making of high complexity. I spent 40 minutes of neurocritical care time in the care of this patient.  Marvel Plan, MD PhD Stroke Neurology 10/30/2018 12:38 PM    To contact Stroke Continuity provider, please refer to [email or web address]. After hours, contact General Neurology

## 2018-10-30 NOTE — Progress Notes (Signed)
NAME:  Cory Golden, MRN:  [id number], DOB:  09-09-1956, LOS: 1 ADMISSION DATE:  10/29/2018, CONSULTATION DATE:  10/29/2018 REFERRING MD:  [facility] EDP, CHIEF COMPLAINT:  Found down   Brief History   62 year old male found down at home on October 29, 2018, hypothermic, found to have evidence of an acute right cerebellar stroke.  Consults:  November 17 neurology  Procedures:  November 17 endotracheal tube November 17 right internal jugular central venous catheter [facility] EDP placed)  Significant Diagnostic Tests:  November 17 CT head showed acute to subacute right cerebellar stroke MRI 11/18 confirms multiple strokes.  Micro Data:  November 17 respiratory culture  Antimicrobials:  November 17 ceftriaxone  Interim history/subjective:  Periodic agitation requiring sedation, not consistently following commands per RN  Objective   Blood pressure 106/71, pulse (!) 58, temperature (!) 97.3 F (36.3 C), resp. rate (!) 26, height 6' (1.829 m), weight 63.8 kg, SpO2 100 %. CVP:  [1 mmHg-15 mmHg] 7 mmHg  Vent Mode: PRVC FiO2 (%):  [30 %] 30 % Set Rate:  [26 bmp] 26 bmp Vt Set:  [520 mL] 520 mL PEEP:  [5 cmH20] 5 cmH20 Plateau Pressure:  [12 cmH20-23 cmH20] 22 cmH20   Intake/Output Summary (Last 24 hours) at 10/30/2018 2028 Last data filed at 10/30/2018 1900 Gross per 24 hour  Intake 1033.61 ml  Output 1050 ml  Net -16.39 ml   Filed Weights   10/29/18 1000  Weight: 63.8 kg    Examination:  General:  Ill-kempt, malnourished. HENT: NCAT ETT in place PULM: Clear chest with dyssynchrony CV: RRR, no mgr, no stigmata of endocarditis. GI: BS+, soft, nontender MSK: normal bulk and tone Neuro: sedated on vent    Assessment & Plan:  62 year old male with a past medical history significant for cocaine abuse now here with acute to subacute right cerebellar infarct, atrial fibrillation, acute respiratory failure with hypoxemia and hypothermia.  Acute  encephalopathy in setting of acute cerebellar stroke: Thiamine Folate Multifocal stroke - Echo to rule out endocarditis   Shock: hypothermia, resolving Continue Levophed titrated to maintain a mean arterial pressure greater than 65 Repeat lactic acid Central venous pressure monitor  Atrial fibrillation: Unclear if new onset Telemetry monitoring Hold beta-blocker given history of cocaine abuse Check urine drug screen Hold anticoagulation until imaging findings discussed with neurology with acute stroke  Acute respiratory failure with hypoxemia due to inability to protect airway Full mechanical ventilatory support Ventilator associated pneumonia prevention protocol Daily wake-up assessment/spontaneous breathing trial  Aspiration pneumonia: Restored culture Ceftriaxone  Elevated AST: Likely alcohol hepatitis? Repeat comprehensive metabolic panel  Hypothermia: Resolving Monitor temperature here in ICU  Cocaine abuse: Urine drug screen    Best practice:  Diet: tube feeding per dietary Pain/Anxiety/Delirium protocol (if indicated): PAD protocol, RASS goal 0  To -1 VAP protocol (if indicated): yes DVT prophylaxis: SCD GI prophylaxis: famotidicine Glucose control: SSI Mobility: bed rest Code Status: full Family Communication: none bedside Disposition:   Labs   CBC: Recent Labs  Lab 10/29/18 1023 10/30/18 0517  WBC 31.0* 20.4*  NEUTROABS 27.8*  --   HGB 14.2 13.3  HCT 46.6 43.5  MCV 98.9 97.1  PLT 331 255    Basic Metabolic Panel: Recent Labs  Lab 10/29/18 1023 10/30/18 0517  NA 142 142  K 3.8 3.6  CL 113* 112*  CO2 22 27  GLUCOSE 98 103*  BUN 17 14  CREATININE 1.35* 1.06  CALCIUM 7.5* 7.6*  MG 1.6* 1.7  PHOS  1.7* 2.7   GFR: Estimated Creatinine Clearance: 64.4 mL/min (by C-G formula based on SCr of 1.06 mg/dL). Recent Labs  Lab 10/29/18 1023 10/30/18 0517  WBC 31.0* 20.4*  LATICACIDVEN 3.0*  --     Liver Function Tests: Recent Labs    Lab 10/29/18 1023  AST 163*  ALT 62*  ALKPHOS 70  BILITOT 0.6  PROT 6.3*  ALBUMIN 3.0*   No results for input(s): LIPASE, AMYLASE in the last 168 hours. No results for input(s): AMMONIA in the last 168 hours.  ABG    Component Value Date/Time   PHART 7.262 (L) 10/29/2018 1017   PCO2ART 37.9 10/29/2018 1017   PO2ART 99.0 10/29/2018 1017   HCO3 17.2 (L) 10/29/2018 1017   TCO2 18 (L) 10/29/2018 1017   ACIDBASEDEF 9.0 (H) 10/29/2018 1017   O2SAT 97.0 10/29/2018 1017     Coagulation Profile: No results for input(s): INR, PROTIME in the last 168 hours.  Cardiac Enzymes: Recent Labs  Lab 10/29/18 1023  CKTOTAL 6,276*    HbA1C: No results found for: HGBA1C  CBG: Recent Labs  Lab 10/30/18 0348 10/30/18 0804 10/30/18 1149 10/30/18 1943 10/30/18 2021  GLUCAP 82 80 91 62* 113*     Critical care time: 30 minutes     [name] Alec Jaros, MD [facility] ICU Physician [facility]  Critical Care  Pager: [phone] Mobile: [phone] After hours: [phone].

## 2018-10-30 NOTE — Progress Notes (Signed)
RT NOTES: Transported to MRI on vent without incident.

## 2018-10-30 NOTE — Progress Notes (Signed)
Initial Nutrition Assessment  DOCUMENTATION CODES:   Severe malnutrition in context of chronic illness  INTERVENTION:   Recommend initiate enteral nutrition therapy via NG tube - messaged MD await response.   Vital AF 1.2 @ 35 ml/hr and increase by 10 ml every 12 hours to goal rate of 65 ml/hr. (1560 ml/day)  Provides: 1872 kcal, 117 grams protein, and 1265 ml free water.   Recommend monitor magnesium and phosphorus every 12 hours x 4 occurances, MD to replete as needed, as pt is at risk for refeeding syndrome given severe malnutrition.   NUTRITION DIAGNOSIS:   Severe Malnutrition related to chronic illness(poss COPD/drug abuse) as evidenced by severe fat depletion, severe muscle depletion.  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    Pt discussed during ICU rounds and with RN.   Patient is currently intubated on ventilator support MV: 15.9 L/min Temp (24hrs), Avg:97.8 F (36.6 C), Min:96.8 F (36 C), Max:98.8 F (37.1 C)  Medications reviewed and include: thiamine, SSI, folic acid, pepcid, precedex Levophed @ 4 mcg Labs reviewed: Potassium and phosphorus are WNL BP: 142/83 MAP: 100   I/O: -1328 ml since admit UOP: 2480 ml x 24 hrs  NG tube in place  NUTRITION - FOCUSED PHYSICAL EXAM:    Most Recent Value  Orbital Region  Severe depletion  Upper Arm Region  Severe depletion  Thoracic and Lumbar Region  Severe depletion  Buccal Region  Unable to assess  Temple Region  Moderate depletion  Clavicle Bone Region  Severe depletion  Clavicle and Acromion Bone Region  Severe depletion  Scapular Bone Region  Unable to assess   Dorsal Hand  Unable to assess  Patellar Region  Moderate depletion  Anterior Thigh Region  Moderate depletion  Posterior Calf Region  Mild depletion  Edema (RD Assessment)  None  Hair  Reviewed  Eyes  Unable to assess  Mouth  Unable to assess  Skin  Reviewed  Nails  Unable to assess       Diet Order:   Diet Order            Diet NPO time specified  Diet effective now              EDUCATION NEEDS:   No education needs have been identified at this time  Skin:  Skin Assessment: (MASD: buttocks; ecchymosis: L hip)  Last BM:  11/17 smear  Height:   Ht Readings from Last 1 Encounters:  10/29/18 6' (1.829 m)    Weight:   Wt Readings from Last 1 Encounters:  10/29/18 63.8 kg    Ideal Body Weight:  80.9 kg  BMI:  Body mass index is 19.08 kg/m.  Estimated Nutritional Needs:   Kcal:  1893  Protein:  95-120 grams  Fluid:  > 1.8 L/day  Kendell BaneHeather Yousef Huge RD, LDN, CNSC [phone] Pager [phone] After Hours Pager

## 2018-10-31 ENCOUNTER — Inpatient Hospital Stay (HOSPITAL_COMMUNITY): Payer: Self-pay

## 2018-10-31 DIAGNOSIS — I639 Cerebral infarction, unspecified: Secondary | ICD-10-CM

## 2018-10-31 DIAGNOSIS — I4891 Unspecified atrial fibrillation: Secondary | ICD-10-CM

## 2018-10-31 DIAGNOSIS — E43 Unspecified severe protein-calorie malnutrition: Secondary | ICD-10-CM

## 2018-10-31 DIAGNOSIS — I6350 Cerebral infarction due to unspecified occlusion or stenosis of unspecified cerebral artery: Secondary | ICD-10-CM

## 2018-10-31 LAB — GLUCOSE, CAPILLARY
GLUCOSE-CAPILLARY: 81 mg/dL (ref 70–99)
Glucose-Capillary: 83 mg/dL (ref 70–99)
Glucose-Capillary: 81 mg/dL (ref 70–99)
Glucose-Capillary: 88 mg/dL (ref 70–99)
Glucose-Capillary: 113 mg/dL — ABNORMAL HIGH (ref 70–99)
Glucose-Capillary: 84 mg/dL (ref 70–99)

## 2018-10-31 LAB — ECHOCARDIOGRAM COMPLETE
HEIGHTINCHES: 72 in
WEIGHTICAEL: 2236.35 [oz_av]

## 2018-10-31 MED ORDER — ASPIRIN 325 MG PO TABS
325.0000 mg | ORAL_TABLET | Freq: Every day | ORAL | Status: DC
  Administered 2018-10-31 – 2018-11-03 (×4): 325 mg via ORAL
  Filled 2018-10-31 (×5): qty 1

## 2018-10-31 MED ORDER — SODIUM CHLORIDE 0.9 % IV SOLN
250.0000 mL | INTRAVENOUS | Status: DC
  Administered 2018-10-31 (×3): 250 mL via INTRAVENOUS

## 2018-10-31 MED ORDER — IPRATROPIUM-ALBUTEROL 0.5-2.5 (3) MG/3ML IN SOLN
3.0000 mL | Freq: Three times a day (TID) | RESPIRATORY_TRACT | Status: DC
  Administered 2018-11-01 – 2018-11-03 (×5): 3 mL via RESPIRATORY_TRACT
  Filled 2018-10-31 (×7): qty 3

## 2018-10-31 MED ORDER — SODIUM CHLORIDE 0.9 % IV SOLN
2.0000 g | Freq: Two times a day (BID) | INTRAVENOUS | Status: DC
  Administered 2018-10-31 (×2): 2 g via INTRAVENOUS
  Filled 2018-10-31 (×3): qty 20

## 2018-10-31 MED ORDER — SODIUM CHLORIDE 0.9 % IV BOLUS
500.0000 mL | Freq: Once | INTRAVENOUS | Status: AC
  Administered 2018-10-31: 500 mL via INTRAVENOUS

## 2018-10-31 MED ORDER — ENSURE ENLIVE PO LIQD
237.0000 mL | Freq: Two times a day (BID) | ORAL | Status: DC
  Administered 2018-10-31 – 2018-11-03 (×5): 237 mL via ORAL

## 2018-10-31 NOTE — Progress Notes (Signed)
Pt pulled NG tube out and is attempting to self extubate.  MD and RT notified.  MD put in extubation orders.  RT extubated pt around 0830.  No new orders at this time.  Pt is resting.  RN will continue to monitor.

## 2018-10-31 NOTE — Procedures (Signed)
Extubation Procedure Note  Patient Details:   Name: Cory Golden DOB: 04/02/1956 MRN: [id number]   Airway Documentation:  +cuff leak test prior to extubation   Vent end date: 10/31/18 Vent end time: 0830   Evaluation  O2 sats: stable throughout Complications: No apparent complications Patient did tolerate procedure well. Bilateral Breath Sounds: Clear, Diminished   Yes pt able to speak. No stridor noted, no distress noted.    Jennette KettleBrowning, Lindley Stachnik Joy 10/31/2018, 8:38 AM

## 2018-10-31 NOTE — Progress Notes (Signed)
STROKE TEAM PROGRESS NOTE   SUBJECTIVE (INTERVAL HISTORY) His RN is at the bedside. Pt was extubated this am and tolerating well. MRI showed cardioembolic pattern infarcts with largest at right cerebellum. Off pressor but BP still soft, no fever. And Blood culture so far negative.    OBJECTIVE Temp:  [96.4 F (35.8 C)-99 F (37.2 C)] 97.9 F (36.6 C) (11/19 1200) Pulse Rate:  [55-89] 67 (11/19 1200) Cardiac Rhythm: Normal sinus rhythm (11/19 0800) Resp:  [11-31] 14 (11/19 1200) BP: (87-160)/(41-83) 127/78 (11/19 1200) SpO2:  [78 %-100 %] 99 % (11/19 1200) FiO2 (%):  [30 %] 30 % (11/19 0755) Weight:  [63.4 kg] 63.4 kg (11/19 0500)  Recent Labs  Lab 10/30/18 2021 10/30/18 2327 10/31/18 0347 10/31/18 0801 10/31/18 1201  GLUCAP 113* 82 83 81 81   Recent Labs  Lab 10/29/18 1023 10/30/18 0517  NA 142 142  K 3.8 3.6  CL 113* 112*  CO2 22 27  GLUCOSE 98 103*  BUN 17 14  CREATININE 1.35* 1.06  CALCIUM 7.5* 7.6*  MG 1.6* 1.7  PHOS 1.7* 2.7   Recent Labs  Lab 10/29/18 1023  AST 163*  ALT 62*  ALKPHOS 70  BILITOT 0.6  PROT 6.3*  ALBUMIN 3.0*   Recent Labs  Lab 10/29/18 1023 10/30/18 0517  WBC 31.0* 20.4*  NEUTROABS 27.8*  --   HGB 14.2 13.3  HCT 46.6 43.5  MCV 98.9 97.1  PLT 331 255   Recent Labs  Lab 10/29/18 1023  CKTOTAL 6,276*   No results for input(s): LABPROT, INR in the last 72 hours. No results for input(s): COLORURINE, LABSPEC, PHURINE, GLUCOSEU, HGBUR, BILIRUBINUR, KETONESUR, PROTEINUR, UROBILINOGEN, NITRITE, LEUKOCYTESUR in the last 72 hours.  Invalid input(s): APPERANCEUR  No results found for: CHOL, TRIG, HDL, CHOLHDL, VLDL, LDLCALC No results found for: HGBA1C    Component Value Date/Time   LABOPIA NONE DETECTED 10/29/2018 1204   COCAINSCRNUR POSITIVE (A) 10/29/2018 1204   LABBENZ POSITIVE (A) 10/29/2018 1204   AMPHETMU NONE DETECTED 10/29/2018 1204   THCU NONE DETECTED 10/29/2018 1204   LABBARB NONE DETECTED 10/29/2018 1204    No  results for input(s): ETH in the last 168 hours.  I have personally reviewed the radiological images below and agree with the radiology interpretations.  Ct Angio Head W Or Wo Contrast  Addendum Date: 10/29/2018   ADDENDUM REPORT: 10/29/2018 15:33 ADDENDUM: Study discussed by telephone with Dr. Kendrick FriesMcQuaid on 10/29/2018 at 1525 hours. Electronically Signed   By: Odessa FlemingH  Hall M.D.   On: 10/29/2018 15:33   Result Date: 10/29/2018 CLINICAL DATA:  62 year old male found unresponsive. [facility] evidence of right cerebellar infarct on plain head CT 0520 hours today. EXAM: CT ANGIOGRAPHY HEAD AND NECK TECHNIQUE: Multidetector CT imaging of the head and neck was performed using the standard protocol during bolus administration of intravenous contrast. Multiplanar CT image reconstructions and MIPs were obtained to evaluate the vascular anatomy. Carotid stenosis measurements (when applicable) are obtained utilizing NASCET criteria, using the distal internal carotid diameter as the denominator. CONTRAST:  50mL ISOVUE-370 IOPAMIDOL (ISOVUE-370) INJECTION 76% [facility] 0520 hours today. FINDINGS: CT HEAD Brain: Less motion artifact. Patchy and confluent hypodensity in the right cerebellum centrally and inferiorly has not significantly changed since 0520 hours today. No associated hemorrhage. There is some cerebellar vermis involvement (series 5, image 9). Mild mass effect on the 4th ventricle but other basilar cisterns are patent. No ventriculomegaly. There  are scattered small foci of cytotoxic edema also in the right cerebral hemisphere including the frontal, parietal, and lateral right occipital lobes (series 8, image 16) which are increased in conspicuity from earlier today. Questionable involvement also of the left parietal lobe. Age indeterminate patchy bilateral white matter hypodensity. No midline shift, mass effect, or evidence of intracranial  mass lesion. Calvarium and skull base: No acute osseous abnormality identified. Paranasal sinuses: Well pneumatized aside from right maxillary mucoperiosteal thickening. Orbits: No acute orbit or scalp soft tissue finding. CTA NECK Skeleton: Absent dentition. No acute osseous abnormality identified. Upper chest: Intubated. Endotracheal tube tip terminates about 3 centimeters above the carina in good position. Right nasoenteric tube courses into the esophagus. Centrilobular and paraseptal emphysema. Upper lungs are clear. No superior mediastinal lymphadenopathy. Other neck: Intubated, fluid in the pharynx. Right nasoenteric tube in place. Otherwise negative. Aortic arch: 3 vessel arch configuration with mild to moderate arch and great vessel origin atherosclerosis. No great vessel origin stenosis. Right carotid system: Negative. Left carotid system: Mild soft plaque in the left CCA. Minimal plaque at the posterior left ICA origin and bulb with no stenosis. Vertebral arteries: No proximal right subclavian artery stenosis despite mild plaque. Calcified plaque at the right vertebral artery origin with mild stenosis. The right vertebral appears non dominant but is patent to the skull base without stenosis. No proximal left subclavian artery stenosis despite plaque. Dominant left vertebral artery with bulky calcified plaque at its origin resulting in moderate stenosis (series 11, image 295). Patent left vertebral with no additional stenosis to the skull base. CTA HEAD Posterior circulation: Patent distal vertebral arteries, the left is dominant. The right functionally terminates in PICA, and the proximal right PICA appears normal. Patent left PICA origin. Normal vertebrobasilar junction. Patent basilar artery. Asymmetric enhancement of the a ICAs, decreased on the right. No basilar artery irregularity or stenosis. Normal SCA and right PCA origins. Fetal left PCA origin. Small right posterior communicating artery.  Bilateral PCA branches are within normal limits. Anterior circulation: Both ICA siphons are patent. Mild calcified plaque on the left with no stenosis. Normal left ophthalmic and posterior communicating artery origins. Mild-to-moderate calcified plaque on the right at the anterior genu with no significant stenosis. Normal ophthalmic and right posterior communicating artery origins. Patent carotid termini. Normal MCA and ACA origins. Anterior communicating artery and bilateral ACA branches are within normal limits. Median artery of the corpus callosum is present (normal variant). Left MCA M1, bifurcation, and left MCA branches are within normal limits. Right MCA M1, trifurcation, and right MCA branches are within normal limits. Venous sinuses: Patent on the delayed images. Anatomic variants: Dominant left vertebral artery, the right functionally terminates in PICA. Fetal left PCA origin. Median artery of the corpus callosum. Delayed phase: No abnormal enhancement identified. Review of the MIP images confirms the above findings IMPRESSION: 1. Negative for large vessel occlusion. Questionable Right AICA poor flow or occlusion. Right PICA is patent. 2. Positive for multifocal cerebellar AND cerebral infarcts in anterior and posterior vascular territories suggesting a recent Embolic Event from the heart or proximal aorta. 3. No associated hemorrhage. Stable cerebellar edema since this morning with 4th ventricular mass effect but no ventriculomegaly. 4. Moderate stenosis at the origin of the dominant left vertebral artery due to calcified plaque. No intracranial stenosis. Mild Cervical carotid atherosclerosis without stenosis. 5. Satisfactory placement of visible endotracheal and nasoenteric tubes. 6. Centrilobular and paraseptal Emphysema (ICD10-J43.9). Electronically Signed: By: Odessa Fleming M.D. On: 10/29/2018 15:12  Ct Angio Neck W Or Wo Contrast  Addendum Date: 10/29/2018   ADDENDUM REPORT: 10/29/2018 15:33  ADDENDUM: Study discussed by telephone with Dr. Kendrick Fries on 10/29/2018 at 1525 hours. Electronically Signed   By: Odessa Fleming M.D.   On: 10/29/2018 15:33   Result Date: 10/29/2018 CLINICAL DATA:  62 year old male found unresponsive. Transferred from [facility] with evidence of right cerebellar infarct on plain head CT 0520 hours today. EXAM: CT ANGIOGRAPHY HEAD AND NECK TECHNIQUE: Multidetector CT imaging of the head and neck was performed using the standard protocol during bolus administration of intravenous contrast. Multiplanar CT image reconstructions and MIPs were obtained to evaluate the vascular anatomy. Carotid stenosis measurements (when applicable) are obtained utilizing NASCET criteria, using the distal internal carotid diameter as the denominator. CONTRAST:  50mL ISOVUE-370 IOPAMIDOL (ISOVUE-370) INJECTION 76% COMPARISON:  [facility] head and cervical spine CT 0520 hours today. FINDINGS: CT HEAD Brain: Less motion artifact. Patchy and confluent hypodensity in the right cerebellum centrally and inferiorly has not significantly changed since 0520 hours today. No associated hemorrhage. There is some cerebellar vermis involvement (series 5, image 9). Mild mass effect on the 4th ventricle but other basilar cisterns are patent. No ventriculomegaly. There are scattered small foci of cytotoxic edema also in the right cerebral hemisphere including the frontal, parietal, and lateral right occipital lobes (series 8, image 16) which are increased in conspicuity from earlier today. Questionable involvement also of the left parietal lobe. Age indeterminate patchy bilateral white matter hypodensity. No midline shift, mass effect, or evidence of intracranial mass lesion. Calvarium and skull base: No acute osseous abnormality identified. Paranasal sinuses: Well pneumatized aside from right maxillary mucoperiosteal thickening. Orbits: No acute orbit or scalp soft tissue finding. CTA NECK Skeleton: Absent  dentition. No acute osseous abnormality identified. Upper chest: Intubated. Endotracheal tube tip terminates about 3 centimeters above the carina in good position. Right nasoenteric tube courses into the esophagus. Centrilobular and paraseptal emphysema. Upper lungs are clear. No superior mediastinal lymphadenopathy. Other neck: Intubated, fluid in the pharynx. Right nasoenteric tube in place. Otherwise negative. Aortic arch: 3 vessel arch configuration with mild to moderate arch and great vessel origin atherosclerosis. No great vessel origin stenosis. Right carotid system: Negative. Left carotid system: Mild soft plaque in the left CCA. Minimal plaque at the posterior left ICA origin and bulb with no stenosis. Vertebral arteries: No proximal right subclavian artery stenosis despite mild plaque. Calcified plaque at the right vertebral artery origin with mild stenosis. The right vertebral appears non dominant but is patent to the skull base without stenosis. No proximal left subclavian artery stenosis despite plaque. Dominant left vertebral artery with bulky calcified plaque at its origin resulting in moderate stenosis (series 11, image 295). Patent left vertebral with no additional stenosis to the skull base. CTA HEAD Posterior circulation: Patent distal vertebral arteries, the left is dominant. The right functionally terminates in PICA, and the proximal right PICA appears normal. Patent left PICA origin. Normal vertebrobasilar junction. Patent basilar artery. Asymmetric enhancement of the a ICAs, decreased on the right. No basilar artery irregularity or stenosis. Normal SCA and right PCA origins. Fetal left PCA origin. Small right posterior communicating artery. Bilateral PCA branches are within normal limits. Anterior circulation: Both ICA siphons are patent. Mild calcified plaque on the left with no stenosis. Normal left ophthalmic and posterior communicating artery origins. Mild-to-moderate calcified plaque on  the right at the anterior genu with no significant stenosis. Normal ophthalmic and right posterior communicating artery origins. Patent  carotid termini. Normal MCA and ACA origins. Anterior communicating artery and bilateral ACA branches are within normal limits. Median artery of the corpus callosum is present (normal variant). Left MCA M1, bifurcation, and left MCA branches are within normal limits. Right MCA M1, trifurcation, and right MCA branches are within normal limits. Venous sinuses: Patent on the delayed images. Anatomic variants: Dominant left vertebral artery, the right functionally terminates in PICA. Fetal left PCA origin. Median artery of the corpus callosum. Delayed phase: No abnormal enhancement identified. Review of the MIP images confirms the above findings IMPRESSION: 1. Negative for large vessel occlusion. Questionable Right AICA poor flow or occlusion. Right PICA is patent. 2. Positive for multifocal cerebellar AND cerebral infarcts in anterior and posterior vascular territories suggesting a recent Embolic Event from the heart or proximal aorta. 3. No associated hemorrhage. Stable cerebellar edema since this morning with 4th ventricular mass effect but no ventriculomegaly. 4. Moderate stenosis at the origin of the dominant left vertebral artery due to calcified plaque. No intracranial stenosis. Mild Cervical carotid atherosclerosis without stenosis. 5. Satisfactory placement of visible endotracheal and nasoenteric tubes. 6. Centrilobular and paraseptal Emphysema (ICD10-J43.9). Electronically Signed: By: Odessa Fleming M.D. On: 10/29/2018 15:12   Mr Brain Wo Contrast  Result Date: 10/30/2018 CLINICAL DATA:  Focal neurological deficit. Found unresponsive. Right cerebellar infarction. Scattered other cerebral infarctions. EXAM: MRI HEAD WITHOUT CONTRAST TECHNIQUE: Multiplanar, multiecho pulse sequences of the brain and surrounding structures were obtained without intravenous contrast. COMPARISON:  CT  studies done yesterday. FINDINGS: Brain: Numerous scattered acute infarctions within the right cerebellum in the cerebellar vermis with minor involvement of the medial aspect of the left cerebellum. No swelling or hemorrhage. Primarily PICA distribution suspected. Cerebral hemispheres show scattered small infarctions within the right hemisphere from front to back suggesting watershed distribution. Infarctions are more numerous in the right posterior parietal region. On the left, there is a single punctate infarction at the left frontoparietal vertex. There chronic small-vessel ischemic changes throughout the pons. There are chronic small-vessel ischemic changes of the cerebral hemispheric deep and subcortical white matter bilaterally. No hemorrhage. No mass effect or shift. No hydrocephalus or extra-axial collection. Vascular: Major vessels at the base of the brain show flow. Skull and upper cervical spine: Negative Sinuses/Orbits: Right maxillary sinusitis. Other sinuses clear. Orbits negative. Other: None IMPRESSION: Extensive acute infarctions within the cerebellum mostly affecting the right hemisphere and vermis, with minor involvement of the medial aspect of the left cerebellar hemisphere. Mild swelling but no hemorrhage or significant mass-effect upon the fourth ventricle. No obstructive hydrocephalus. Numerous scattered infarctions in the right cerebral hemisphere from front to back, most typical of watershed distribution. Mild swelling but no hemorrhage or mass effect. Single punctate infarction at the left frontoparietal vertex. Electronically Signed   By: Paulina Fusi M.D.   On: 10/30/2018 16:36   Dg Chest Port 1 View  Result Date: 10/30/2018 CLINICAL DATA:  Acute respiratory failure with hypoxemia EXAM: PORTABLE CHEST 1 VIEW COMPARISON:  10/29/2018 FINDINGS: Endotracheal tube in good position. Right jugular central venous catheter tip in the mid SVC unchanged. NG tube in place. COPD with pulmonary  hyperinflation. Progression of mild bibasilar airspace disease. No effusion. IMPRESSION: Support lines remain in good position and unchanged COPD with progression of bibasilar atelectasis/infiltrate. Electronically Signed   By: Marlan Palau M.D.   On: 10/30/2018 08:16     PHYSICAL EXAM  Temp:  [96.4 F (35.8 C)-99 F (37.2 C)] 97.9 F (36.6 C) (11/19 1200) Pulse Rate:  [  55-89] 67 (11/19 1200) Resp:  [11-31] 14 (11/19 1200) BP: (87-160)/(41-83) 127/78 (11/19 1200) SpO2:  [78 %-100 %] 99 % (11/19 1200) FiO2 (%):  [30 %] 30 % (11/19 0755) Weight:  [63.4 kg] 63.4 kg (11/19 0500)  General - cachectic, well developed, extubated  Ophthalmologic - fundi not visualized due to noncooperation.  Cardiovascular - Regular rate and rhythm.  Neuro - extubated off pressor, awake alert and eyes open, following simple commands. Orientated to self and place but not to age time or situation. PERRL, eye mid position, moving to both sides, PERRL, visual field full, no facial asymmetry. Tongue protrusion not able to test. Moving all extremities equally 4+/5. DTR 1+ and no babinski. Sensation symmetrical, b/l FTN slow but no obvious ataxia or dysmetria. Gait not tested.   ASSESSMENT/PLAN [name] is a 62 y.o. male with history of cocaine use admitted for found down with hypothermia and ? Seizure activity. No tPA given due to outside window.    Stroke:  Multifocal infarcts involving right cerebellar, b/l frontal and b/l MCA/PCA territory, embolic, secondary to unclear source, concerning for endocarditis  Resultant intubated and AMS  MRI multifocal infarcts largest at right cerebllum, but also involving left cerebellar, right MCA/PCA and MCA/ACA, b/l ACAs and punctate left MCA  CTA head and neck - unremarkable except left VA proximal stenosis, no mycotic aneurysm  2D Echo  pending  TEE likely to be needed to rule out endocarditis  EEG no seizure  LDL pending  HgbA1c pending  SCDs for VTE  prophylaxis  No antithrombotic prior to admission, now on ASA 325mg   Ongoing aggressive stroke risk factor management  Therapy recommendations: pending   Disposition:  Pending   Sepsis with left lung infiltration, hypothermia, ? endocarditis  CCM on board  CXR showed left lung infiltration  On rocephin  Sputum and blood culture NGTD  Hypothermia resolved so far  May need TEE to evaluate endocarditis  ? afib   As per H&P note, "EKG showed evidence of atrial fibrillation"  No afib on available EKGs at this time  Tele so far no afib in ICU  If no endocarditis found, we may consider AC given extensive cardioembolic stroke this time.   Hypotension . Soft BP while off levophed . Septic shock ?? . Stable than yesterday  Long term BP goal normotensive  Cocaine abuse   UDS positive for cocaine and benzo  Cessation education/consunseling will be provided later  Other Stroke Risk Factors  Advanced age  Other Active Problems  Leukocytosis - WBC 31.0->20.4  AKI - Cre 1.35->1.06  Hospital day # 2  This patient is critically ill due to AMS, multifocal strokes, sepsis, ? Afib, pneumonia, hypotension, cocaine use and at significant risk of neurological worsening, death form septic shock, seizure, recurrent stroke, hemorrhagic conversion, heart failure, cardiac arrest. This patient's care requires constant monitoring of vital signs, hemodynamics, respiratory and cardiac monitoring, review of multiple databases, neurological assessment, discussion with family, other specialists and medical decision making of high complexity. I spent 40 minutes of neurocritical care time in the care of this patient.  Marvel Plan, MD PhD Stroke Neurology 10/31/2018 12:09 PM    To contact Stroke Continuity provider, please refer to [email or web address]. After hours, contact General Neurology

## 2018-10-31 NOTE — Progress Notes (Signed)
Preliminary notes---Bilateral lower extremities venous duplex exam completed. Negative for DVT.  Armel Rabbani H Miroslava Santellan(RDMS RVT) 10/31/18 4:12 PM

## 2018-10-31 NOTE — Progress Notes (Signed)
NAME:  Cory Golden, MRN:  [id number], DOB:  December 13, 1956, LOS: 2 ADMISSION DATE:  10/29/2018, CONSULTATION DATE:  10/29/2018 REFERRING MD:  [facility] ED[facility], CHIEF COMPLAINT:  Found down   Brief History   62 year old male found down at home on October 29, 2018, hypothermic, found to have evidence of an acute right cerebellar stroke.  Consults:  November 17 neurology  Procedures:  November 17 endotracheal tube November 17 right internal jugular central venous catheter [facility] EDP placed)  Significant Diagnostic Tests:  November 17 CT head showed acute to subacute right cerebellar stroke MRI 11/18 confirms multiple strokes.  Micro Data:  November 17 respiratory culture - rare GNR/mod GPC Blood cultures 11/17/ - NGTD  Antimicrobials:  November 17 ceftriaxone  Interim history/subjective:  Periodic agitation requiring sedation, not consistently following commands per RN. Attempt to sit up and self extubate last pm.  Objective   Blood pressure 118/71, pulse (!) 59, temperature 98.2 F (36.8 C), resp. rate (!) 26, height 6' (1.829 m), weight 63.4 kg, SpO2 100 %. CVP:  [5 mmHg-13 mmHg] 9 mmHg  Vent Mode: PRVC FiO2 (%):  [30 %] 30 % Set Rate:  [26 bmp] 26 bmp Vt Set:  [520 mL] 520 mL PEEP:  [5 cmH20] 5 cmH20 Plateau Pressure:  [12 cmH20-23 cmH20] 19 cmH20   Intake/Output Summary (Last 24 hours) at 10/31/2018 0759 Last data filed at 10/31/2018 0700 Gross per 24 hour  Intake 991.95 ml  Output 525 ml  Net 466.95 ml   Filed Weights   10/29/18 1000 10/31/18 0500  Weight: 63.8 kg 63.4 kg    Examination:  General:  Ill-kempt, malnourished. HENT: NCAT ETT in place PULM: Clear chest with dyssynchrony. Tolerating PSV 5/5 CV: RRR, no mgr, no stigmata of endocarditis. GI: BS+, soft, nontender MSK: normal bulk and tone Neuro: sedation off. Somnolent but following commands in all 4 limbs.    Assessment & Plan:  Critically ill due to respiratory failure secondary  to inability to protect airway. Encephalopathy from multifocal stroke.  Plan SBT with plan to extubate today Limit sedation to encourage alertness Continue stroke work up looking for cardioembolic source - echo today. Continue empiric endocarditis coverage - high clinical suspicion.  Best practice:  Diet: tube feeding per dietary Pain/Anxiety/Delirium protocol (if indicated): PAD protocol, RASS goal 0  To -1 VAP protocol (if indicated): yes DVT prophylaxis: SCD GI prophylaxis: famotidicine Glucose control: SSI Mobility: bed rest Code Status: full Family Communication: none bedside Disposition:   Labs   CBC: Recent Labs  Lab 10/29/18 1023 10/30/18 0517  WBC 31.0* 20.4*  NEUTROABS 27.8*  --   HGB 14.2 13.3  HCT 46.6 43.5  MCV 98.9 97.1  PLT 331 255    Basic Metabolic Panel: Recent Labs  Lab 10/29/18 1023 10/30/18 0517  NA 142 142  K 3.8 3.6  CL 113* 112*  CO2 22 27  GLUCOSE 98 103*  BUN 17 14  CREATININE 1.35* 1.06  CALCIUM 7.5* 7.6*  MG 1.6* 1.7  PHOS 1.7* 2.7   GFR: Estimated Creatinine Clearance: 64 mL/min (by C-G formula based on SCr of 1.06 mg/dL). Recent Labs  Lab 10/29/18 1023 10/30/18 0517  WBC 31.0* 20.4*  LATICACIDVEN 3.0*  --     Liver Function Tests: Recent Labs  Lab 10/29/18 1023  AST 163*  ALT 62*  ALKPHOS 70  BILITOT 0.6  PROT 6.3*  ALBUMIN 3.0*   No results for input(s): LIPASE, AMYLASE in the last 168 hours. No results  for input(s): AMMONIA in the last 168 hours.  ABG    Component Value Date/Time   PHART 7.262 (L) 10/29/2018 1017   PCO2ART 37.9 10/29/2018 1017   PO2ART 99.0 10/29/2018 1017   HCO3 17.2 (L) 10/29/2018 1017   TCO2 18 (L) 10/29/2018 1017   ACIDBASEDEF 9.0 (H) 10/29/2018 1017   O2SAT 97.0 10/29/2018 1017     Coagulation Profile: No results for input(s): INR, PROTIME in the last 168 hours.  Cardiac Enzymes: Recent Labs  Lab 10/29/18 1023  CKTOTAL 6,276*    HbA1C: No results found for:  HGBA1C  CBG: Recent Labs  Lab 10/30/18 1149 10/30/18 1943 10/30/18 2021 10/30/18 2327 10/31/18 0347  GLUCAP 91 62* 113* 82 83     Critical care time: 40 minutes     [name] Shaine Newmark, MD [facility] ICU Physician [facility]  Pager: [phone] Mobile: [phone] After hours: [phone].

## 2018-10-31 NOTE — Evaluation (Signed)
Clinical/Bedside Swallow Evaluation Patient Details  Name: Cory Golden MRN: [id number] Date of Birth: 07/09/1956  Today's Date: 10/31/2018 Time: SLP Start Time (ACUTE ONLY): 1326 SLP Stop Time (ACUTE ONLY): 1345 SLP Time Calculation (min) (ACUTE ONLY): 19 min  Past Medical History: History reviewed. No pertinent past medical history. Past Surgical History: History reviewed. No pertinent surgical history. HPI:  62 year old male with a history of cocaine abuse was found down around 2 AM on October 29, 2018 in his RV.  His core temperature by EMS was 85 degrees and there was concern for seizure activity.  [facility] and intubated for airway protection.  There he received warm saline, warm blankets.  His EKG showed evidence of atrial fibrillation, a chest x-ray showed an infiltrate in the left lung, and a head CT showed an acute to subacute right cerebellar stroke. CXR 11/18: "COPD with progression of bibasilar atelectasis/infiltrate"  MRI 11/18: "IMPRESSION: Extensive acute infarctions within the cerebellum mostly affecting the right hemisphere and vermis, with minor involvement of the medial aspect of the left cerebellar hemisphere. Mild swelling but no hemorrhage or significant mass-effect upon the fourth ventricle. No obstructive hydrocephalus. Numerous scattered infarctions in the right cerebral hemisphere from front to back, most typical of watershed distribution. Mild swelling but no hemorrhage or mass effect. Single punctate infarction at the left frontoparietal vertex."   Assessment / Plan / Recommendation Clinical Impression  Pt presents with mild risk for aspiration in setting of multifocal CVA and VDRF with 2 day intubation.  Pt exhibited functional oral phase of swallowing with good clearance of regular solids despite edentulism.  WIth thin liquid pt required 4-5 swallows with initial trial and 2-3 with subsequent trials.  There was throat clearing noted after thin  liquids.  Pt's voice was gravelly at baseline, likely related to intubation, but pt acheives strong phonation.  WIth trials of nectar thick liquid there was no throat clear and number of swallows was significantly decreased.  Recommend initiating regular diet with nectar thick liquid pending results of further instrumental assessment.  Pt's speech does not appear dysarthric and pt denies word-finding deficits.  Pt was not fully oriented to location (hospital, but not city) and may benefit from further evaluation of cognitive function if his current presentation is not consistent with baseline. SLP Visit Diagnosis: Dysphagia, oropharyngeal phase (R13.12)    Aspiration Risk  Mild aspiration risk    Diet Recommendation Regular;Nectar-thick liquid   Liquid Administration via: Cup;Straw Medication Administration: Whole meds with liquid Compensations: Slow rate;Small sips/bites Postural Changes: Seated upright at 90 degrees    Other  Recommendations Oral Care Recommendations: Oral care BID   Follow up Recommendations    Pending instrumental evaluation    Frequency and Duration     Pending instrumental evaluation       Prognosis   Good     Swallow Study   General Date of Onset: 10/29/18 HPI: 62 year old male with a history of cocaine abuse was found down around 2 AM on October 29, 2018 in his RV.  His core temperature by EMS was 85 degrees and there was concern for seizure activity.  [facility] and intubated for airway protection.  There he received warm saline, warm blankets.  His EKG showed evidence of atrial fibrillation, a chest x-ray showed an infiltrate in the left lung, and a head CT showed an acute to subacute right cerebellar stroke. CXR 11/18: "COPD with progression of bibasilar atelectasis/infiltrate"  MRI 11/18: "  IMPRESSION: Extensive acute infarctions within the cerebellum mostly affecting the right hemisphere and vermis, with minor involvement of the  medial aspect of the left cerebellar hemisphere. Mild swelling but no hemorrhage or significant mass-effect upon the fourth ventricle. No obstructive hydrocephalus. Numerous scattered infarctions in the right cerebral hemisphere from front to back, most typical of watershed distribution. Mild swelling but no hemorrhage or mass effect. Single punctate infarction at the left frontoparietal vertex." Type of Study: Bedside Swallow Evaluation Diet Prior to this Study: NPO Temperature Spikes Noted: No Respiratory Status: Nasal cannula History of Recent Intubation: Yes Length of Intubations (days): 2 days Date extubated: 10/31/18 Behavior/Cognition: Alert;Cooperative;Pleasant mood Oral Cavity Assessment: Within Functional Limits Oral Cavity - Dentition: Edentulous(does not wear dentures) Self-Feeding Abilities: Able to feed self Baseline Vocal Quality: (Dysphonic) Volitional Cough: Strong Volitional Swallow: Able to elicit    Oral/Motor/Sensory Function Overall Oral Motor/Sensory Function: Mild impairment Facial ROM: Within Functional Limits Facial Symmetry: Within Functional Limits Lingual ROM: Within Functional Limits Lingual Symmetry: Abnormal symmetry left Lingual Strength: Within Functional Limits Velum: Within Functional Limits Mandible: Within Functional Limits   Ice Chips Ice chips: Not tested   Thin Liquid Thin Liquid: Impaired Presentation: Cup Pharyngeal  Phase Impairments: Multiple swallows;Throat Clearing - Delayed    Nectar Thick Nectar Thick Liquid: Within functional limits Presentation: Cup;Straw Pharyngeal Phase Impairments: Multiple swallows Other Comments: tolerated serial straw sips   Honey Thick Honey Thick Liquid: Not tested   Puree Puree: Within functional limits Presentation: Spoon   Solid     Solid: Within functional limits Presentation: Self Fed Other Comments: large bites noted      Kerrie Pleasure, MA, CCC-SLP Acute Rehabilitation Services Office:  [phone]; Pager (11/19): [phone] 10/31/2018,1:57 PM

## 2018-10-31 NOTE — Progress Notes (Signed)
MD notified that pt is hypotensive.  Verbal order for 500 cc bolus.  RN will continue to monitor.

## 2018-10-31 NOTE — Progress Notes (Signed)
  Echocardiogram 2D Echocardiogram has been performed.  Celene SkeenVijay  Jaydynn Wolford 10/31/2018, 11:21 AM

## 2018-10-31 NOTE — Progress Notes (Signed)
Nutrition Follow-up  DOCUMENTATION CODES:   Severe malnutrition in context of chronic illness  INTERVENTION:   Ensure Enlive po BID, each supplement provides 350 kcal and 20 grams of protein  Magic cup TID with meals, each supplement provides 290 kcal and 9 grams of protein   NUTRITION DIAGNOSIS:   Severe Malnutrition related to chronic illness(poss COPD/drug abuse) as evidenced by severe fat depletion, severe muscle depletion. Ongoing  GOAL:   Patient will meet greater than or equal to 90% of their needs Progressing  MONITOR:   PO intake, Supplement acceptance  ASSESSMENT:   Pt with PMH of cocaine abuse admitted after being found down, hypothermic in his RV with acute to subacute R cerebellar infarct, afib, and acute respiratory failure with hypoxemia.    11/19 extubated, started on diet  Medications reviewed and include: SSI Labs reviewed   I/O: -792 ml since admit UOP: 525 ml x 24 hrs (decreased)   NG tube in place   Diet Order:   Diet Order            Diet regular Room service appropriate? Yes; Fluid consistency: Nectar Thick  Diet effective now              EDUCATION NEEDS:   No education needs have been identified at this time  Skin:  Skin Assessment: (MASD: buttocks; ecchymosis: L hip)  Last BM:  11/17 smear  Height:   Ht Readings from Last 1 Encounters:  10/29/18 6' (1.829 m)    Weight:   Wt Readings from Last 1 Encounters:  10/31/18 63.4 kg    Ideal Body Weight:  80.9 kg  BMI:  Body mass index is 18.96 kg/m.  Estimated Nutritional Needs:   Kcal:  1800-2000  Protein:  95-120 grams  Fluid:  > 1.8 L/day  Kendell BaneHeather Ericah Scotto RD, LDN, CNSC [phone] Pager [phone] After Hours Pager

## 2018-10-31 NOTE — Progress Notes (Signed)
Pt pulled out central line in an attempt to get out of bed, no oozing/hematoma at the site.  PIV started, pt educated (clearly needs reinforcement).

## 2018-11-01 ENCOUNTER — Inpatient Hospital Stay (HOSPITAL_COMMUNITY): Payer: Self-pay

## 2018-11-01 LAB — GLUCOSE, CAPILLARY
GLUCOSE-CAPILLARY: 55 mg/dL — AB (ref 70–99)
GLUCOSE-CAPILLARY: 74 mg/dL (ref 70–99)
GLUCOSE-CAPILLARY: 102 mg/dL — AB (ref 70–99)
Glucose-Capillary: 72 mg/dL (ref 70–99)
Glucose-Capillary: 82 mg/dL (ref 70–99)
Glucose-Capillary: 122 mg/dL — ABNORMAL HIGH (ref 70–99)

## 2018-11-01 LAB — BASIC METABOLIC PANEL
ANION GAP: 6 (ref 5–15)
BUN: 8 mg/dL (ref 8–23)
CHLORIDE: 109 mmol/L (ref 98–111)
CO2: 26 mmol/L (ref 22–32)
Calcium: 8.4 mg/dL — ABNORMAL LOW (ref 8.9–10.3)
Creatinine, Ser: 1.01 mg/dL (ref 0.61–1.24)
GFR calc Af Amer: >60 mL/min (ref >60)
GLUCOSE: 85 mg/dL (ref 70–99)
POTASSIUM: 3.1 mmol/L — AB (ref 3.5–5.1)
Sodium: 141 mmol/L (ref 135–145)

## 2018-11-01 LAB — CBC WITH DIFFERENTIAL/PLATELET
ABS IMMATURE GRANULOCYTES: 0.05 10*3/uL (ref 0.00–0.07)
Basophils Absolute: 0 10*3/uL (ref 0.0–0.1)
Basophils Relative: 0 %
Eosinophils Absolute: 0.3 10*3/uL (ref 0.0–0.5)
Eosinophils Relative: 3 %
HEMATOCRIT: 39.8 % (ref 39.0–52.0)
HEMOGLOBIN: 12.9 g/dL — AB (ref 13.0–17.0)
IMMATURE GRANULOCYTES: 1 %
LYMPHS ABS: 1.2 10*3/uL (ref 0.7–4.0)
Lymphocytes Relative: 12 %
MCH: 30.6 pg (ref 26.0–34.0)
MCHC: 32.4 g/dL (ref 30.0–36.0)
MCV: 94.3 fL (ref 80.0–100.0)
MONO ABS: 0.7 10*3/uL (ref 0.1–1.0)
MONOS PCT: 6 %
NEUTROS ABS: 8.1 10*3/uL — AB (ref 1.7–7.7)
NEUTROS PCT: 78 %
Platelets: 200 10*3/uL (ref 150–400)
RBC: 4.22 MIL/uL (ref 4.22–5.81)
RDW: 13.2 % (ref 11.5–15.5)
WBC: 10.3 10*3/uL (ref 4.0–10.5)
nRBC: 0 % (ref 0.0–0.2)

## 2018-11-01 LAB — CULTURE, RESPIRATORY

## 2018-11-01 LAB — CULTURE, RESPIRATORY W GRAM STAIN

## 2018-11-01 LAB — HEPATIC FUNCTION PANEL
ALK PHOS: 53 U/L (ref 38–126)
ALT: 96 U/L — AB (ref 0–44)
AST: 322 U/L — AB (ref 15–41)
Albumin: 2.3 g/dL — ABNORMAL LOW (ref 3.5–5.0)
BILIRUBIN DIRECT: 0.1 mg/dL (ref 0.0–0.2)
BILIRUBIN TOTAL: 0.6 mg/dL (ref 0.3–1.2)
Indirect Bilirubin: 0.5 mg/dL (ref 0.3–0.9)
Total Protein: 5.8 g/dL — ABNORMAL LOW (ref 6.5–8.1)

## 2018-11-01 LAB — HEMOGLOBIN A1C
HEMOGLOBIN A1C: 5.2 % (ref 4.8–5.6)
MEAN PLASMA GLUCOSE: 102.54 mg/dL

## 2018-11-01 LAB — LIPID PANEL
Cholesterol: 104 mg/dL (ref 0–200)
HDL: 29 mg/dL — AB (ref >40)
LDL CALC: 61 mg/dL (ref 0–99)
Total CHOL/HDL Ratio: 3.6 RATIO
Triglycerides: 72 mg/dL (ref <150)
VLDL: 14 mg/dL (ref 0–40)

## 2018-11-01 LAB — RPR: RPR Ser Ql: NONREACTIVE

## 2018-11-01 LAB — VITAMIN B12: Vitamin B-12: 237 pg/mL (ref 180–914)

## 2018-11-01 LAB — TSH: TSH: 4.679 u[IU]/mL — ABNORMAL HIGH (ref 0.350–4.500)

## 2018-11-01 MED ORDER — SODIUM CHLORIDE 0.9 % IV SOLN: INTRAVENOUS | Status: DC

## 2018-11-01 MED ORDER — POTASSIUM CHLORIDE CRYS ER 20 MEQ PO TBCR
40.0000 meq | EXTENDED_RELEASE_TABLET | Freq: Once | ORAL | Status: AC
  Administered 2018-11-01: 40 meq via ORAL
  Filled 2018-11-01: qty 2

## 2018-11-01 MED ORDER — LEVOFLOXACIN 750 MG PO TABS
750.0000 mg | ORAL_TABLET | Freq: Every day | ORAL | Status: AC
  Administered 2018-11-01 – 2018-11-03 (×3): 750 mg via ORAL
  Filled 2018-11-01 (×3): qty 1

## 2018-11-01 MED ORDER — CEFDINIR 300 MG PO CAPS: 300.0000 mg | ORAL_CAPSULE | Freq: Two times a day (BID) | ORAL | Status: DC

## 2018-11-01 MED ORDER — SODIUM CHLORIDE 0.9 % IV SOLN: 1.0000 g | INTRAVENOUS | Status: DC

## 2018-11-01 MED ORDER — ENOXAPARIN SODIUM 40 MG/0.4ML ~~LOC~~ SOLN
40.0000 mg | SUBCUTANEOUS | Status: DC
  Administered 2018-11-01 – 2018-11-03 (×3): 40 mg via SUBCUTANEOUS
  Filled 2018-11-01 (×3): qty 0.4

## 2018-11-01 NOTE — Progress Notes (Signed)
  Speech Language Pathology Treatment: Dysphagia  Patient Details Name: Cory Golden MRN: [id number] DOB: 08/22/1956 Today's Date: 11/01/2018 Time: [id number] SLP Time Calculation (min) (ACUTE ONLY): 8 min  Assessment / Plan / Recommendation Clinical Impression  Pt was alert and participatory during dysphagia treatment session today. He denied any difficulty with PO intake since last ST visit and was receptive to SLPs education regarding aspiration precautions and the correlation between aspiration and pneumonia. Given min verbal cues to take small sips, no overt s/s aspiration were observed with nectar thick liquids. However, pt exhibited delayed coughing with upgraded trial of thin liquid. Pt would benefit from instrumental swallow evaluation in order to fully assess swallow function and efficiency. Recommend continue regular texture diet with nectar thick liquids until MBSS is completed this afternoon at 2:00 PM.    HPI HPI: 62 year old male with a history of cocaine abuse was found down around 2 AM on October 29, 2018 in his RV.  His core temperature by EMS was 85 degrees and there was concern for seizure activity.  [facility] and intubated for airway protection.  There he received warm saline, warm blankets.  His EKG showed evidence of atrial fibrillation, a chest x-ray showed an infiltrate in the left lung, and a head CT showed an acute to subacute right cerebellar stroke. CXR 11/18: "COPD with progression of bibasilar atelectasis/infiltrate"  MRI 11/18:       SLP Plan  MBS       Recommendations  Diet recommendations: Nectar-thick liquid;Regular Liquids provided via: Cup Medication Administration: Whole meds with liquid Supervision: Patient able to self feed Compensations: Slow rate;Small sips/bites Postural Changes and/or Swallow Maneuvers: Seated upright 90 degrees                Oral Care Recommendations: Oral care BID Follow up Recommendations: Other  (comment)(tbd) SLP Visit Diagnosis: Dysphagia, pharyngeal phase (R13.13) Plan: MBS       Suzzette RighterErin Raya Mckinstry, Student SLP                 Suzzette RighterErin Connar Keating 11/01/2018, 1:45 PM

## 2018-11-01 NOTE — Evaluation (Signed)
Occupational Therapy Evaluation Patient Details Name: Cory Golden MRN: [id number] DOB: 1956-06-11 Today's Date: 11/01/2018    History of Present Illness Pt is a 62 year old male with a history of cocaine abuse was found down around 2 AM on October 29, 2018 in his RV with hypothermia and ?seizure activity. 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    Clinical Impression   PTA patient reports independent with ADLs and mobility, lives in RV.  Patient currently admitted for above and limited by impaired balance, impaired coordination, decreased activity tolerance, impulsivity and decreased safety awareness. Pt currently requires min assist for UB ADLs, mod assist for LB ADLs, mod assist for transfers to commode +2 for safety, mod assist for toileting, and mod assist +2 for mobility for safety. Pt will benefit from continued OT services while admitted and after dc at intensive CIR level (unsure of assist level at dc, but maybe able to progress towards modified independent level) to maximize independence and return to PLOF.  Will continue to follow while admitted.     Follow Up Recommendations  CIR    Equipment Recommendations  3 in 1 bedside commode    Recommendations for Other Services Rehab consult     Precautions / Restrictions Precautions Precautions: Fall Restrictions Weight Bearing Restrictions: No      Mobility Bed Mobility               General bed mobility comments: seated OOB in recliner upon entry  Transfers Overall transfer level: Needs assistance Equipment used: None Transfers: Sit to/from Stand Sit to Stand: +2 safety/equipment;Min assist         General transfer comment: min assist for sit to stand, but requires mod assist +2 for safety with intiation of movement or head turns    Balance Overall balance assessment: Needs  assistance Sitting-balance support: No upper extremity supported;Feet supported Sitting balance-Leahy Scale: Good     Standing balance support: No upper extremity supported;During functional activity Standing balance-Leahy Scale: Poor Standing balance comment: posterior and L lateral lean with head turns to R; reliant on external support                           ADL either performed or assessed with clinical judgement   ADL Overall ADL's : Needs assistance/impaired Eating/Feeding: Set up;Sitting   Grooming: Minimal assistance;Sitting   Upper Body Bathing: Minimal assistance;Sitting   Lower Body Bathing: Moderate assistance;Sit to/from stand   Upper Body Dressing : Minimal assistance;Sitting   Lower Body Dressing: Moderate assistance;Sit to/from stand   Toilet Transfer: Moderate assistance;+2 for safety/equipment;Ambulation;Grab bars;Regular Social worker and Hygiene: Moderate assistance;+2 for safety/equipment;Sit to/from stand       Functional mobility during ADLs: Moderate assistance;+2 for safety/equipment;Cueing for safety;Cueing for sequencing General ADL Comments: pt limited by impaired coordination and balance, decreased safety awareness and impulsivity     Vision Baseline Vision/History: No visual deficits Patient Visual Report: No change from baseline Vision Assessment?: Yes Eye Alignment: Within Functional Limits Ocular Range of Motion: Within Functional Limits Alignment/Gaze Preference: Within Defined Limits Tracking/Visual Pursuits: Able to track stimulus in all quads without difficulty Visual Fields: No apparent deficits Additional Comments: peripheral vision loss bilaterally ~15 degrees     Perception     Praxis      Pertinent Vitals/Pain Pain Assessment: No/denies pain Faces Pain Scale: No hurt  Hand Dominance Right   Extremity/Trunk Assessment Upper Extremity Assessment Upper Extremity Assessment:  RUE deficits/detail;LUE deficits/detail RUE Deficits / Details: dysmetric, but functional; grossly 4+/5 MMT RUE Coordination: decreased gross motor LUE Deficits / Details: dysmetric, grossly 4+/5 MMT  LUE Coordination: decreased fine motor;decreased gross motor   Lower Extremity Assessment Lower Extremity Assessment: Defer to PT evaluation       Communication Communication Communication: No difficulties   Cognition Arousal/Alertness: Awake/alert Behavior During Therapy: WFL for tasks assessed/performed;Impulsive Overall Cognitive Status: Impaired/Different from baseline Area of Impairment: Attention;Following commands;Memory;Safety/judgement;Awareness;Problem solving                   Current Attention Level: Sustained Memory: Decreased recall of precautions;Decreased short-term memory Following Commands: Follows multi-step commands inconsistently;Follows multi-step commands with increased time Safety/Judgement: Decreased awareness of safety;Decreased awareness of deficits Awareness: Emergent Problem Solving: Slow processing;Difficulty sequencing;Requires verbal cues;Requires tactile cues     General Comments       Exercises     Shoulder Instructions      Home Living Family/patient expects to be discharged to:: Unsure Living Arrangements: Alone Available Help at Discharge: Other (Comment)(RV)                             Additional Comments: patient lives in RV with limited support      Prior Functioning/Environment Level of Independence: Independent        Comments: independent with ADLs and mobility        OT Problem List: Decreased activity tolerance;Impaired balance (sitting and/or standing);Decreased coordination;Decreased safety awareness;Decreased knowledge of use of DME or AE;Decreased knowledge of precautions      OT Treatment/Interventions: Self-care/ADL training;Neuromuscular education;DME and/or AE instruction;Therapeutic  activities;Patient/family education;Balance training    OT Goals(Current goals can be found in the care plan section) Acute Rehab OT Goals Patient Stated Goal: to get better OT Goal Formulation: With patient Time For Goal Achievement: 11/15/18 Potential to Achieve Goals: Good  OT Frequency: Min 3X/week   Barriers to D/C:            Co-evaluation PT/OT/SLP Co-Evaluation/Treatment: Yes Reason for Co-Treatment: For patient/therapist safety;To address functional/ADL transfers   OT goals addressed during session: ADL's and self-care;Other (comment)(mobility)      AM-PAC PT "6 Clicks" Daily Activity     Outcome Measure Help from another person eating meals?: A Little Help from another person taking care of personal grooming?: A Little Help from another person toileting, which includes using toliet, bedpan, or urinal?: A Lot Help from another person bathing (including washing, rinsing, drying)?: A Lot Help from another person to put on and taking off regular upper body clothing?: A Little Help from another person to put on and taking off regular lower body clothing?: A Lot 6 Click Score: 15   End of Session Nurse Communication: Mobility status  Activity Tolerance: Patient tolerated treatment well Patient left: in chair;with call bell/phone within reach;with chair alarm set  OT Visit Diagnosis: Unsteadiness on feet (R26.81);Other abnormalities of gait and mobility (R26.89)                Time: [id number] OT Time Calculation (min): 21 min Charges:  OT General Charges $OT Visit: 1 Visit OT Evaluation $OT Eval Moderate Complexity: 1 Mod  Chancy Milroyhristie S Chuong Casebeer, OT Acute Rehabilitation Services Pager [phone] Office [phone]   Chancy MilroyChristie S Yacine Droz 11/01/2018, 3:03 PM

## 2018-11-01 NOTE — Evaluation (Signed)
Physical Therapy Evaluation Patient Details Name: Cory Golden MRN: [id number] DOB: 12/06/1956 Today's Date: 11/01/2018   History of Present Illness  Pt is a 62 year old male with a history of cocaine abuse was found down around 2 AM on October 29, 2018 in his RV with hypothermia and ?seizure activity. 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   Clinical Impression  Pt admitted with/for the following infarcts affecting both right/left sides.  Pt shows to be more uncoordinated than weak bil needing min to mod assist for help coordinating activity..  Pt currently limited functionally due to the problems listed. ( See problems list.)   Pt will benefit from PT to maximize function and safety in order to get ready for next venue listed below.     Follow Up Recommendations CIR    Equipment Recommendations  Other (comment)(TBA)    Recommendations for Other Services Rehab consult     Precautions / Restrictions Precautions Precautions: Fall Restrictions Weight Bearing Restrictions: No      Mobility  Bed Mobility               General bed mobility comments: seated OOB in recliner upon entry  Transfers Overall transfer level: Needs assistance Equipment used: None Transfers: Sit to/from Stand Sit to Stand: +2 safety/equipment;Min assist         General transfer comment: min assist for sit to stand, but requires mod assist +2 for safety with intiation of movement or head turns  Ambulation/Gait Ambulation/Gait assistance: Min assist;Mod assist;+2 safety/equipment Gait Distance (Feet): 140 Feet Assistive device: IV Pole;None Gait Pattern/deviations: Step-through pattern;Ataxic;Wide base of support     General Gait Details: pt's gait characterized by wide BOS, mildly ataxic L>R LE, mild wandering and staggering.  Once  muscle fatigue set in right LE buckled on 2  occasions.  Stairs            Wheelchair Mobility    Modified Rankin (Stroke Patients Only) Modified Rankin (Stroke Patients Only) Pre-Morbid Rankin Score: No symptoms Modified Rankin: Moderately severe disability     Balance Overall balance assessment: Needs assistance Sitting-balance support: No upper extremity supported;Feet supported Sitting balance-Leahy Scale: Good     Standing balance support: No upper extremity supported;During functional activity Standing balance-Leahy Scale: Poor Standing balance comment: posterior and L lateral lean with head turns to R; reliant on external support                             Pertinent Vitals/Pain Pain Assessment: No/denies pain    Home Living Family/patient expects to be discharged to:: Unsure Living Arrangements: Alone Available Help at Discharge: Friend(s);Available PRN/intermittently Type of Home: Other(Comment)(RV) Home Access: Stairs to enter Entrance Stairs-Rails: Right;Left(rail) Entrance Stairs-Number of Steps: 2 Home Layout: One level Home Equipment: None Additional Comments: patient lives in RV with limited support    Prior Function Level of Independence: Independent         Comments: independent with ADLs and mobility     Hand Dominance   Dominant Hand: Right    Extremity/Trunk Assessment   Upper Extremity Assessment Upper Extremity Assessment: RUE deficits/detail RUE Deficits / Details: dysmetric, but functional; grossly 4+/5 MMT RUE Coordination: decreased gross motor LUE Deficits / Details: dysmetric, grossly 4+/5 MMT  LUE Coordination: decreased fine motor;decreased gross motor    Lower Extremity Assessment Lower Extremity Assessment: RLE deficits/detail;LLE deficits/detail  RLE Deficits / Details: incoordination with rapid alternating movements RLE Coordination: decreased fine motor LLE Deficits / Details: ataxic and uncoordinated, difficulty controlling swing through LLE  Coordination: decreased fine motor       Communication   Communication: No difficulties  Cognition Arousal/Alertness: Awake/alert Behavior During Therapy: WFL for tasks assessed/performed;Impulsive Overall Cognitive Status: Impaired/Different from baseline Area of Impairment: Attention;Following commands;Memory;Safety/judgement;Awareness;Problem solving                   Current Attention Level: Sustained Memory: Decreased recall of precautions;Decreased short-term memory Following Commands: Follows multi-step commands inconsistently;Follows multi-step commands with increased time Safety/Judgement: Decreased awareness of safety;Decreased awareness of deficits Awareness: Emergent Problem Solving: Slow processing;Difficulty sequencing;Requires verbal cues;Requires tactile cues        General Comments      Exercises     Assessment/Plan    PT Assessment Patient needs continued PT services  PT Problem List Decreased activity tolerance;Decreased balance;Decreased mobility;Decreased coordination;Decreased knowledge of use of DME       PT Treatment Interventions Gait training;DME instruction;Stair training;Functional mobility training;Therapeutic activities;Balance training;Patient/family education    PT Goals (Current goals can be found in the Care Plan section)  Acute Rehab PT Goals Patient Stated Goal: to get better PT Goal Formulation: With patient Time For Goal Achievement: 11/15/18 Potential to Achieve Goals: Good    Frequency Min 3X/week   Barriers to discharge Decreased caregiver support      Co-evaluation PT/OT/SLP Co-Evaluation/Treatment: Yes Reason for Co-Treatment: For patient/therapist safety PT goals addressed during session: Mobility/safety with mobility OT goals addressed during session: ADL's and self-care       AM-PAC PT "6 Clicks" Daily Activity  Outcome Measure Difficulty turning over in bed (including adjusting bedclothes, sheets and  blankets)?: A Little Difficulty moving from lying on back to sitting on the side of the bed? : A Little Difficulty sitting down on and standing up from a chair with arms (e.g., wheelchair, bedside commode, etc,.)?: Unable Help needed moving to and from a bed to chair (including a wheelchair)?: A Little Help needed walking in hospital room?: A Lot Help needed climbing 3-5 steps with a railing? : A Lot 6 Click Score: 14    End of Session   Activity Tolerance: Patient tolerated treatment well(notable muscle fatigue ) Patient left: in chair;with call bell/phone within reach;with chair alarm set Nurse Communication: Mobility status PT Visit Diagnosis: Unsteadiness on feet (R26.81);Other symptoms and signs involving the nervous system (R29.898);Ataxic gait (R26.0);Difficulty in walking, not elsewhere classified (R26.2)    Time: 1610-9604 PT Time Calculation (min) (ACUTE ONLY): 26 min   Charges:   PT Evaluation $PT Eval Moderate Complexity: 1 Mod          11/01/2018  Webb Bing, PT Acute Rehabilitation Services [phone]  (pager) [phone]  (office)  [name] Eveline Sauve 11/01/2018, 6:09 PM

## 2018-11-01 NOTE — Progress Notes (Signed)
NAME:  Cory Golden, MRN:  [id number], DOB:  10-Apr-1956, LOS: 3 ADMISSION DATE:  10/29/2018, CONSULTATION DATE:  10/29/2018 REFERRING MD:  [facility] EDP, CHIEF COMPLAINT:  Found down   Brief History   62 year old male found down at home on October 29, 2018, hypothermic, found to have evidence of an acute right cerebellar stroke.  Consults:  November 17 neurology  Procedures:  November 17 endotracheal tube November 17 right internal jugular central venous catheter [facility] EDP placed)  Significant Diagnostic Tests:  November 17 CT head showed acute to subacute right cerebellar stroke MRI 11/18 confirms multiple strokes. Echo 11/19: Normal LVSF with G1DD, no source of cardioembolism. Micro Data:  November 17 respiratory culture - rare GNR/mod GPC Blood cultures 11/17/ - NGTD  Antimicrobials:  November 17 ceftriaxone  Interim history/subjective:  Denies headache, diplopia. Appetite has been good.  Objective   Blood pressure (!) 147/83, pulse 76, temperature 98.5 F (36.9 C), temperature source Oral, resp. rate 15, height 6' (1.829 m), weight 63.2 kg, SpO2 97 %. CVP:  [5 mmHg-10 mmHg] 9 mmHg  Vent Mode: PSV;CPAP FiO2 (%):  [30 %] 30 % PEEP:  [5 cmH20] 5 cmH20 Pressure Support:  [5 cmH20] 5 cmH20   Intake/Output Summary (Last 24 hours) at 11/01/2018 0752 Last data filed at 11/01/2018 0700 Gross per 24 hour  Intake 1124.86 ml  Output 850 ml  Net 274.86 ml   Filed Weights   10/29/18 1000 10/31/18 0500 11/01/18 0313  Weight: 63.8 kg 63.4 kg 63.2 kg    Examination:  General:  Ill-kempt, malnourished. HENT: No stigmata of endocarditis. PULM: Clear chest without wheezing. CV: RRR, no mgr, no edema. GI: BS+, soft, nontender MSK: normal bulk and tone Neuro: awake, speech fluent, no facial asymmetry, normal strength no dysmetria    Assessment & Plan:  Respiratory failure has resolved. Encephalopathy has resolved  Multiple strokes but no signs of  cardioembolism or endocarditis. New transaminitis - possibly antibiotic related  Plan Progressive ambulation Complete 7 days of CAP coverage with levofloxacin Ready for transfer to telemetry. May need event monitor to rule out atrial fibrillation Secondary prevention with ASA, statin Trend LFT's and start statin once normalize.  Best practice:  Diet: heart healthy diet  Pain/Anxiety/Delirium protocol (if indicated): N/A VAP protocol (if indicated): N/A DVT prophylaxis: lovenox GI prophylaxis: n/a Glucose control: SSI Mobility: progressive ambulation Code Status: full Family Communication: none bedside Disposition: transfer to telemetry.  Labs   CBC: Recent Labs  Lab 10/29/18 1023 10/30/18 0517 11/01/18 0411  WBC 31.0* 20.4* 10.3  NEUTROABS 27.8*  --  8.1*  HGB 14.2 13.3 12.9*  HCT 46.6 43.5 39.8  MCV 98.9 97.1 94.3  PLT 331 255 200    Basic Metabolic Panel: Recent Labs  Lab 10/29/18 1023 10/30/18 0517 11/01/18 0411  NA 142 142 141  K 3.8 3.6 3.1*  CL 113* 112* 109  CO2 22 27 26   GLUCOSE 98 103* 85  BUN 17 14 8   CREATININE 1.35* 1.06 1.01  CALCIUM 7.5* 7.6* 8.4*  MG 1.6* 1.7  --   PHOS 1.7* 2.7  --    GFR: Estimated Creatinine Clearance: 66.9 mL/min (by C-G formula based on SCr of 1.01 mg/dL). Recent Labs  Lab 10/29/18 1023 10/30/18 0517 11/01/18 0411  WBC 31.0* 20.4* 10.3  LATICACIDVEN 3.0*  --   --     Liver Function Tests: Recent Labs  Lab 10/29/18 1023 11/01/18 0411  AST 163* 322*  ALT 62* 96*  ALKPHOS  70 53  BILITOT 0.6 0.6  PROT 6.3* 5.8*  ALBUMIN 3.0* 2.3*   No results for input(s): LIPASE, AMYLASE in the last 168 hours. No results for input(s): AMMONIA in the last 168 hours.  ABG    Component Value Date/Time   PHART 7.262 (L) 10/29/2018 1017   PCO2ART 37.9 10/29/2018 1017   PO2ART 99.0 10/29/2018 1017   HCO3 17.2 (L) 10/29/2018 1017   TCO2 18 (L) 10/29/2018 1017   ACIDBASEDEF 9.0 (H) 10/29/2018 1017   O2SAT 97.0  10/29/2018 1017     Coagulation Profile: No results for input(s): INR, PROTIME in the last 168 hours.  Cardiac Enzymes: Recent Labs  Lab 10/29/18 1023  CKTOTAL 6,276*    HbA1C: Hgb A1c MFr Bld  Date/Time Value Ref Range Status  11/01/2018 04:17 AM 5.2 4.8 - 5.6 % Final    Comment:    (NOTE) Pre diabetes:          5.7%-6.4% Diabetes:              >6.4% Glycemic control for   <7.0% adults with diabetes     CBG: Recent Labs  Lab 10/31/18 1935 10/31/18 2322 11/01/18 0329 11/01/18 0408 11/01/18 0733  GLUCAP 113* 84 55* 74 72     35 min spent with >50% in counseling and coordination of care.     [name] Araiyah Cumpton, MD [facility] ICU Physician [facility]  Pager: [phone] Mobile: [phone] After hours: [phone].

## 2018-11-01 NOTE — Progress Notes (Signed)
Modified Barium Swallow Progress Note  Patient Details  Name: Cory Golden MRN: [id number] Date of Birth: 02/08/1956  Today's Date: 11/01/2018  Modified Barium Swallow completed.  Full report located under Chart Review in the Imaging Section.  Brief recommendations include the following:  Clinical Impression   Pt has a mild-moderate oropharyngeal dysphagia primarily impacting liquid consumption. He has appropriate oropharyngeal clearance and airway protection with solids. With liquids he has more trouble maintaining a cohesive bolus, and liquids end up spilling prematurely into the pharynx. There is reduced hyolaryngeal movement, base of tongue retraction, and epiglottic inversion, which squeezes the liquids that reach the pyriform sinuses before the swallow into the laryngeal vestibule. Although pt did seem to adequately clear his airway across the study either, either during the same swallow or upon subsequent swallows, there were times when thin liquids would reach and even possibly go on the underside of the vocal folds prior to being ejected. Moderate residue also remained in the valleculae and pyriform sinuses with liquids, which he reduced with a spontaneous second swallow while maintaining good airway protection. A chin tuck seemed to limit how deep penetration reached during the swallow, but it did not eliminate it. Given risk for aspiration across the course of a meal, particularly with pt's impulsivity, recommend continuing current diet (regular diet, nectar thick liquids) with SLP f/u ideally during a meal to assess for potential to advance.    Swallow Evaluation Recommendations       SLP Diet Recommendations: Regular solids;Nectar thick liquid   Liquid Administration via: Cup;Straw   Medication Administration: Whole meds with puree   Supervision: Patient able to self feed   Compensations: Slow rate;Small sips/bites   Postural Changes: Seated upright at 90 degrees   Oral Care  Recommendations: Oral care BID        Maxcine Hamaiewonsky, Estel Tonelli 11/01/2018,3:14 PM   Maxcine HamLaura Paiewonsky, M.A. CCC-SLP Acute Herbalistehabilitation Services Pager [phone] Office [phone]

## 2018-11-01 NOTE — Progress Notes (Signed)
  Speech Language Pathology  Patient Details Name: Cory Golden MRN: [id number] DOB: 05/26/1956 Today's Date: 11/01/2018 Time: 1105-      MBS scheduled for 1400              Royce MacadamiaLitaker, Jerni Selmer Willis 11/01/2018, 11:15 AM   Breck CoonsLisa Willis Lonell FaceLitaker M.Ed Nurse, children'sCCC-SLP Speech-Language Pathologist Pager [phone] Office [phone]

## 2018-11-01 NOTE — Plan of Care (Signed)
Care plan updated. Fraizer, Teiara Baria RN BSN 

## 2018-11-01 NOTE — Progress Notes (Signed)
   [facility] has been requested to perform a transesophageal echocardiogram on Cory Golden for acute stroke.  After careful review of history and examination, the risks and benefits of transesophageal echocardiogram have been explained including risks of esophageal damage, perforation (1:10,000 risk), bleeding, pharyngeal hematoma as well as other potential complications associated with conscious sedation including aspiration, arrhythmia, respiratory failure and death. Alternatives to treatment were discussed, questions were answered. Patient is willing to proceed.   Procedure is scheduled for Friday 11/03/2018 at 9:00am with Dr. Mayford Knifeurner.   Corrin ParkerCallie E Agatha Duplechain, PA-C 11/01/2018 4:26 PM

## 2018-11-01 NOTE — Progress Notes (Signed)
Rehab Admissions Coordinator Note:  Patient was screened by Clois DupesBoyette, Neviah Braud Godwin for appropriateness for an Inpatient Acute Rehab Consult per OT recommendation.  .  At this time, we are recommending Inpatient Rehab consult. Please place order for consult.  Ottie GlazierBarbara Hetvi Shawhan, RN, MSN Rehab Admissions Coordinator [phone] 11/01/2018 4:12 PM

## 2018-11-01 NOTE — Progress Notes (Signed)
Hypoglycemic Event 0329  CBG: 55  Treatment: 120mL of orange juice   Symptoms: None  Follow-up CBG: Time: 0408  CBG Result: 74  Possible Reasons for Event: Poor eating  Comments/MD notified:Resolved - no need to notify physician    Sherrie GeorgeMegan Sanvika Cuttino RN, BSN

## 2018-11-01 NOTE — Progress Notes (Addendum)
STROKE TEAM PROGRESS NOTE   SUBJECTIVE (INTERVAL HISTORY) His RN is at the bedside. Pt is stting in chair. Not in acute distress. I updated his brother yesterday over the phone. Brother confirmed that he was using cocaine. blood culture so far negative.  Pt admitted cocaine use but not IV drug abuse. He still weak on BLEs when transition from bed to chair.   OBJECTIVE Temp:  [97.9 F (36.6 C)-99 F (37.2 C)] 98 F (36.7 C) (11/20 0800) Pulse Rate:  [64-85] 78 (11/20 1000) Cardiac Rhythm: Normal sinus rhythm (11/20 1000) Resp:  [13-24] 19 (11/20 1000) BP: (121-167)/(62-94) 158/88 (11/20 1000) SpO2:  [95 %-100 %] 98 % (11/20 1000) Weight:  [63.2 kg] 63.2 kg (11/20 0313)  Recent Labs  Lab 10/31/18 1935 10/31/18 2322 11/01/18 0329 11/01/18 0408 11/01/18 0733  GLUCAP 113* 84 55* 74 72   Recent Labs  Lab 10/29/18 1023 10/30/18 0517 11/01/18 0411  NA 142 142 141  K 3.8 3.6 3.1*  CL 113* 112* 109  CO2 22 27 26   GLUCOSE 98 103* 85  BUN 17 14 8   CREATININE 1.35* 1.06 1.01  CALCIUM 7.5* 7.6* 8.4*  MG 1.6* 1.7  --   PHOS 1.7* 2.7  --    Recent Labs  Lab 10/29/18 1023 11/01/18 0411  AST 163* 322*  ALT 62* 96*  ALKPHOS 70 53  BILITOT 0.6 0.6  PROT 6.3* 5.8*  ALBUMIN 3.0* 2.3*   Recent Labs  Lab 10/29/18 1023 10/30/18 0517 11/01/18 0411  WBC 31.0* 20.4* 10.3  NEUTROABS 27.8*  --  8.1*  HGB 14.2 13.3 12.9*  HCT 46.6 43.5 39.8  MCV 98.9 97.1 94.3  PLT 331 255 200   Recent Labs  Lab 10/29/18 1023  CKTOTAL 6,276*   No results for input(s): LABPROT, INR in the last 72 hours. No results for input(s): COLORURINE, LABSPEC, PHURINE, GLUCOSEU, HGBUR, BILIRUBINUR, KETONESUR, PROTEINUR, UROBILINOGEN, NITRITE, LEUKOCYTESUR in the last 72 hours.  Invalid input(s): APPERANCEUR     Component Value Date/Time   CHOL 104 11/01/2018 0417   TRIG 72 11/01/2018 0417   HDL 29 (L) 11/01/2018 0417   CHOLHDL 3.6 11/01/2018 0417   VLDL 14 11/01/2018 0417   LDLCALC 61 11/01/2018  0417   Lab Results  Component Value Date   HGBA1C 5.2 11/01/2018      Component Value Date/Time   LABOPIA NONE DETECTED 10/29/2018 1204   COCAINSCRNUR POSITIVE (A) 10/29/2018 1204   LABBENZ POSITIVE (A) 10/29/2018 1204   AMPHETMU NONE DETECTED 10/29/2018 1204   THCU NONE DETECTED 10/29/2018 1204   LABBARB NONE DETECTED 10/29/2018 1204    No results for input(s): ETH in the last 168 hours.  I have personally reviewed the radiological images below and agree with the radiology interpretations.  Ct Angio Head W Or Wo Contrast  Addendum Date: 10/29/2018   ADDENDUM REPORT: 10/29/2018 15:33 ADDENDUM: Study discussed by telephone with Dr. Kendrick Fries on 10/29/2018 at 1525 hours. Electronically Signed   By: Odessa Fleming M.D.   On: 10/29/2018 15:33   Result Date: 10/29/2018 CLINICAL DATA:  62 year old male found unresponsive. Transferred from [facility] with evidence of right cerebellar infarct on plain head CT 0520 hours today. EXAM: CT ANGIOGRAPHY HEAD AND NECK TECHNIQUE: Multidetector CT imaging of the head and neck was performed using the standard protocol during bolus administration of intravenous contrast. Multiplanar CT image reconstructions and MIPs were obtained to evaluate the vascular anatomy. Carotid stenosis measurements (when applicable) are obtained utilizing NASCET criteria, using  the distal internal carotid diameter as the denominator. CONTRAST:  50mL ISOVUE-370 IOPAMIDOL (ISOVUE-370) INJECTION 76% COMPARISON:  [facility] head and cervical spine CT 0520 hours today. FINDINGS: CT HEAD Brain: Less motion artifact. Patchy and confluent hypodensity in the right cerebellum centrally and inferiorly has not significantly changed since 0520 hours today. No associated hemorrhage. There is some cerebellar vermis involvement (series 5, image 9). Mild mass effect on the 4th ventricle but other basilar cisterns are patent. No ventriculomegaly. There are scattered small foci of cytotoxic edema  also in the right cerebral hemisphere including the frontal, parietal, and lateral right occipital lobes (series 8, image 16) which are increased in conspicuity from earlier today. Questionable involvement also of the left parietal lobe. Age indeterminate patchy bilateral white matter hypodensity. No midline shift, mass effect, or evidence of intracranial mass lesion. Calvarium and skull base: No acute osseous abnormality identified. Paranasal sinuses: Well pneumatized aside from right maxillary mucoperiosteal thickening. Orbits: No acute orbit or scalp soft tissue finding. CTA NECK Skeleton: Absent dentition. No acute osseous abnormality identified. Upper chest: Intubated. Endotracheal tube tip terminates about 3 centimeters above the carina in good position. Right nasoenteric tube courses into the esophagus. Centrilobular and paraseptal emphysema. Upper lungs are clear. No superior mediastinal lymphadenopathy. Other neck: Intubated, fluid in the pharynx. Right nasoenteric tube in place. Otherwise negative. Aortic arch: 3 vessel arch configuration with mild to moderate arch and great vessel origin atherosclerosis. No great vessel origin stenosis. Right carotid system: Negative. Left carotid system: Mild soft plaque in the left CCA. Minimal plaque at the posterior left ICA origin and bulb with no stenosis. Vertebral arteries: No proximal right subclavian artery stenosis despite mild plaque. Calcified plaque at the right vertebral artery origin with mild stenosis. The right vertebral appears non dominant but is patent to the skull base without stenosis. No proximal left subclavian artery stenosis despite plaque. Dominant left vertebral artery with bulky calcified plaque at its origin resulting in moderate stenosis (series 11, image 295). Patent left vertebral with no additional stenosis to the skull base. CTA HEAD Posterior circulation: Patent distal vertebral arteries, the left is dominant. The right functionally  terminates in PICA, and the proximal right PICA appears normal. Patent left PICA origin. Normal vertebrobasilar junction. Patent basilar artery. Asymmetric enhancement of the a ICAs, decreased on the right. No basilar artery irregularity or stenosis. Normal SCA and right PCA origins. Fetal left PCA origin. Small right posterior communicating artery. Bilateral PCA branches are within normal limits. Anterior circulation: Both ICA siphons are patent. Mild calcified plaque on the left with no stenosis. Normal left ophthalmic and posterior communicating artery origins. Mild-to-moderate calcified plaque on the right at the anterior genu with no significant stenosis. Normal ophthalmic and right posterior communicating artery origins. Patent carotid termini. Normal MCA and ACA origins. Anterior communicating artery and bilateral ACA branches are within normal limits. Median artery of the corpus callosum is present (normal variant). Left MCA M1, bifurcation, and left MCA branches are within normal limits. Right MCA M1, trifurcation, and right MCA branches are within normal limits. Venous sinuses: Patent on the delayed images. Anatomic variants: Dominant left vertebral artery, the right functionally terminates in PICA. Fetal left PCA origin. Median artery of the corpus callosum. Delayed phase: No abnormal enhancement identified. Review of the MIP images confirms the above findings IMPRESSION: 1. Negative for large vessel occlusion. Questionable Right AICA poor flow or occlusion. Right PICA is patent. 2. Positive for multifocal cerebellar AND cerebral infarcts in anterior and  posterior vascular territories suggesting a recent Embolic Event from the heart or proximal aorta. 3. No associated hemorrhage. Stable cerebellar edema since this morning with 4th ventricular mass effect but no ventriculomegaly. 4. Moderate stenosis at the origin of the dominant left vertebral artery due to calcified plaque. No intracranial stenosis. Mild  Cervical carotid atherosclerosis without stenosis. 5. Satisfactory placement of visible endotracheal and nasoenteric tubes. 6. Centrilobular and paraseptal Emphysema (ICD10-J43.9). Electronically Signed: By: Odessa Fleming M.D. On: 10/29/2018 15:12   Ct Angio Neck W Or Wo Contrast  Addendum Date: 10/29/2018   ADDENDUM REPORT: 10/29/2018 15:33 ADDENDUM: Study discussed by telephone with Dr. Kendrick Fries on 10/29/2018 at 1525 hours. Electronically Signed   By: Odessa Fleming M.D.   On: 10/29/2018 15:33   Result Date: 10/29/2018 CLINICAL DATA:  62 year old male found unresponsive. Transferred from [facility] with evidence of right cerebellar infarct on plain head CT 0520 hours today. EXAM: CT ANGIOGRAPHY HEAD AND NECK TECHNIQUE: Multidetector CT imaging of the head and neck was performed using the standard protocol during bolus administration of intravenous contrast. Multiplanar CT image reconstructions and MIPs were obtained to evaluate the vascular anatomy. Carotid stenosis measurements (when applicable) are obtained utilizing NASCET criteria, using the distal internal carotid diameter as the denominator. CONTRAST:  50mL ISOVUE-370 IOPAMIDOL (ISOVUE-370) INJECTION 76% COMPARISON:  [facility] head and cervical spine CT 0520 hours today. FINDINGS: CT HEAD Brain: Less motion artifact. Patchy and confluent hypodensity in the right cerebellum centrally and inferiorly has not significantly changed since 0520 hours today. No associated hemorrhage. There is some cerebellar vermis involvement (series 5, image 9). Mild mass effect on the 4th ventricle but other basilar cisterns are patent. No ventriculomegaly. There are scattered small foci of cytotoxic edema also in the right cerebral hemisphere including the frontal, parietal, and lateral right occipital lobes (series 8, image 16) which are increased in conspicuity from earlier today. Questionable involvement also of the left parietal lobe. Age indeterminate patchy  bilateral white matter hypodensity. No midline shift, mass effect, or evidence of intracranial mass lesion. Calvarium and skull base: No acute osseous abnormality identified. Paranasal sinuses: Well pneumatized aside from right maxillary mucoperiosteal thickening. Orbits: No acute orbit or scalp soft tissue finding. CTA NECK Skeleton: Absent dentition. No acute osseous abnormality identified. Upper chest: Intubated. Endotracheal tube tip terminates about 3 centimeters above the carina in good position. Right nasoenteric tube courses into the esophagus. Centrilobular and paraseptal emphysema. Upper lungs are clear. No superior mediastinal lymphadenopathy. Other neck: Intubated, fluid in the pharynx. Right nasoenteric tube in place. Otherwise negative. Aortic arch: 3 vessel arch configuration with mild to moderate arch and great vessel origin atherosclerosis. No great vessel origin stenosis. Right carotid system: Negative. Left carotid system: Mild soft plaque in the left CCA. Minimal plaque at the posterior left ICA origin and bulb with no stenosis. Vertebral arteries: No proximal right subclavian artery stenosis despite mild plaque. Calcified plaque at the right vertebral artery origin with mild stenosis. The right vertebral appears non dominant but is patent to the skull base without stenosis. No proximal left subclavian artery stenosis despite plaque. Dominant left vertebral artery with bulky calcified plaque at its origin resulting in moderate stenosis (series 11, image 295). Patent left vertebral with no additional stenosis to the skull base. CTA HEAD Posterior circulation: Patent distal vertebral arteries, the left is dominant. The right functionally terminates in PICA, and the proximal right PICA appears normal. Patent left PICA origin. Normal vertebrobasilar junction. Patent basilar artery. Asymmetric enhancement  of the a ICAs, decreased on the right. No basilar artery irregularity or stenosis. Normal SCA and  right PCA origins. Fetal left PCA origin. Small right posterior communicating artery. Bilateral PCA branches are within normal limits. Anterior circulation: Both ICA siphons are patent. Mild calcified plaque on the left with no stenosis. Normal left ophthalmic and posterior communicating artery origins. Mild-to-moderate calcified plaque on the right at the anterior genu with no significant stenosis. Normal ophthalmic and right posterior communicating artery origins. Patent carotid termini. Normal MCA and ACA origins. Anterior communicating artery and bilateral ACA branches are within normal limits. Median artery of the corpus callosum is present (normal variant). Left MCA M1, bifurcation, and left MCA branches are within normal limits. Right MCA M1, trifurcation, and right MCA branches are within normal limits. Venous sinuses: Patent on the delayed images. Anatomic variants: Dominant left vertebral artery, the right functionally terminates in PICA. Fetal left PCA origin. Median artery of the corpus callosum. Delayed phase: No abnormal enhancement identified. Review of the MIP images confirms the above findings IMPRESSION: 1. Negative for large vessel occlusion. Questionable Right AICA poor flow or occlusion. Right PICA is patent. 2. Positive for multifocal cerebellar AND cerebral infarcts in anterior and posterior vascular territories suggesting a recent Embolic Event from the heart or proximal aorta. 3. No associated hemorrhage. Stable cerebellar edema since this morning with 4th ventricular mass effect but no ventriculomegaly. 4. Moderate stenosis at the origin of the dominant left vertebral artery due to calcified plaque. No intracranial stenosis. Mild Cervical carotid atherosclerosis without stenosis. 5. Satisfactory placement of visible endotracheal and nasoenteric tubes. 6. Centrilobular and paraseptal Emphysema (ICD10-J43.9). Electronically Signed: By: Odessa Fleming M.D. On: 10/29/2018 15:12   Mr Brain Wo  Contrast  Result Date: 10/30/2018 CLINICAL DATA:  Focal neurological deficit. Found unresponsive. Right cerebellar infarction. Scattered other cerebral infarctions. EXAM: MRI HEAD WITHOUT CONTRAST TECHNIQUE: Multiplanar, multiecho pulse sequences of the brain and surrounding structures were obtained without intravenous contrast. COMPARISON:  CT studies done yesterday. FINDINGS: Brain: Numerous scattered acute infarctions within the right cerebellum in the cerebellar vermis with minor involvement of the medial aspect of the left cerebellum. No swelling or hemorrhage. Primarily PICA distribution suspected. Cerebral hemispheres show scattered small infarctions within the right hemisphere from front to back suggesting watershed distribution. Infarctions are more numerous in the right posterior parietal region. On the left, there is a single punctate infarction at the left frontoparietal vertex. There chronic small-vessel ischemic changes throughout the pons. There are chronic small-vessel ischemic changes of the cerebral hemispheric deep and subcortical white matter bilaterally. No hemorrhage. No mass effect or shift. No hydrocephalus or extra-axial collection. Vascular: Major vessels at the base of the brain show flow. Skull and upper cervical spine: Negative Sinuses/Orbits: Right maxillary sinusitis. Other sinuses clear. Orbits negative. Other: None IMPRESSION: Extensive acute infarctions within the cerebellum mostly affecting the right hemisphere and vermis, with minor involvement of the medial aspect of the left cerebellar hemisphere. Mild swelling but no hemorrhage or significant mass-effect upon the fourth ventricle. No obstructive hydrocephalus. Numerous scattered infarctions in the right cerebral hemisphere from front to back, most typical of watershed distribution. Mild swelling but no hemorrhage or mass effect. Single punctate infarction at the left frontoparietal vertex. Electronically Signed   By: Paulina Fusi M.D.   On: 10/30/2018 16:36   Dg Chest Port 1 View  Result Date: 10/30/2018 CLINICAL DATA:  Acute respiratory failure with hypoxemia EXAM: PORTABLE CHEST 1 VIEW COMPARISON:  10/29/2018 FINDINGS: Endotracheal  tube in good position. Right jugular central venous catheter tip in the mid SVC unchanged. NG tube in place. COPD with pulmonary hyperinflation. Progression of mild bibasilar airspace disease. No effusion. IMPRESSION: Support lines remain in good position and unchanged COPD with progression of bibasilar atelectasis/infiltrate. Electronically Signed   By: Marlan Palauharles  Clark M.D.   On: 10/30/2018 08:16     PHYSICAL EXAM  Temp:  [97.9 F (36.6 C)-99 F (37.2 C)] 98 F (36.7 C) (11/20 0800) Pulse Rate:  [64-85] 78 (11/20 1000) Resp:  [13-24] 19 (11/20 1000) BP: (121-167)/(62-94) 158/88 (11/20 1000) SpO2:  [95 %-100 %] 98 % (11/20 1000) Weight:  [63.2 kg] 63.2 kg (11/20 0313)  General - cachectic, well developed, extubated  Ophthalmologic - fundi not visualized due to noncooperation.  Cardiovascular - Regular rate and rhythm.  Neuro - awake alert and eyes open, following simple commands. Orientated to self and place but not to age time or situation. PERRL, eye mid position, moving to both sides, PERRL, visual field full, no facial asymmetry. Tongue protrusion not able to test. Moving all extremities equally 4+/5 BUEs and 4/5 BLEs. DTR 1+ and no babinski. Sensation symmetrical, no ataxia or dysmetria. Gait not able to test, but weak BLEs on transition from bed to chair.   ASSESSMENT/PLAN Mr. Cory Hearingony Golden is a 62 y.o. male with history of cocaine use admitted for found down with hypothermia and ? Seizure activity. No tPA given due to outside window.    Stroke:  Multifocal infarcts involving right cerebellar, b/l frontal and b/l MCA/PCA territory, embolic, secondary to unclear source, concerning for endocarditis  Resultant intubated and AMS  MRI multifocal infarcts largest at right  cerebllum, but also involving left cerebellar, right MCA/PCA and MCA/ACA, b/l ACAs and punctate left MCA  CTA head and neck - unremarkable except left VA proximal stenosis, no mycotic aneurysm  2D Echo EF 60-65%  TEE warrented to rule out PFO, cardiac thrombus, and endocarditis given drug abuse and cardioembolic strokes - cardiology contacted   EEG no seizure  LE venous doppler no DVT  LDL 61  HgbA1c 5.2  SCDs for VTE prophylaxis  No antithrombotic prior to admission, now on ASA 325mg   Ongoing aggressive stroke risk factor management  Therapy recommendations: pending   Disposition:  Pending   Sepsis with left lung infiltration, hypothermia, ? endocarditis  CCM on board  CXR showed left lung infiltration  On rocephin -> levofloxacin  Sputum and blood culture NGTD  Hypothermia resolved so far  Will do TEE to evaluate endocarditis  ? afib   As per H&P note, "EKG showed evidence of atrial fibrillation"  No afib on available EKGs at this time  Tele so far no afib in ICU  If no endocarditis found, we may consider 30 day cardiac event monitoring or AC given extensive cardioembolic stroke this time.   Hypotension, resolved . BP stable . Resolved Septic shock ??  Long term BP goal normotensive  Cocaine abuse   UDS positive for cocaine and benzo  Cessation education/consunseling was provided  Pt is willing to quit  Other Stroke Risk Factors  Advanced age  Other Active Problems  Leukocytosis - WBC 31.0->20.4->10.3  AKI - Cre 1.35->1.06->1.01  Hospital day # 3   Marvel PlanJindong Khori Rosevear, MD PhD Stroke Neurology 11/01/2018 10:33 AM    To contact Stroke Continuity provider, please refer to [email or web address]. After hours, contact General Neurology

## 2018-11-02 DIAGNOSIS — I69393 Ataxia following cerebral infarction: Secondary | ICD-10-CM

## 2018-11-02 LAB — COMPREHENSIVE METABOLIC PANEL
ALBUMIN: 2.5 g/dL — AB (ref 3.5–5.0)
ALK PHOS: 58 U/L (ref 38–126)
ALT: 111 U/L — ABNORMAL HIGH (ref 0–44)
ANION GAP: 7 (ref 5–15)
AST: 338 U/L — ABNORMAL HIGH (ref 15–41)
BUN: 5 mg/dL — ABNORMAL LOW (ref 8–23)
CALCIUM: 8.6 mg/dL — AB (ref 8.9–10.3)
CO2: 25 mmol/L (ref 22–32)
Chloride: 107 mmol/L (ref 98–111)
Creatinine, Ser: 0.89 mg/dL (ref 0.61–1.24)
GFR calc Af Amer: >60 mL/min (ref >60)
GFR calc non Af Amer: >60 mL/min (ref >60)
GLUCOSE: 98 mg/dL (ref 70–99)
POTASSIUM: 3.6 mmol/L (ref 3.5–5.1)
SODIUM: 139 mmol/L (ref 135–145)
Total Bilirubin: 1 mg/dL (ref 0.3–1.2)
Total Protein: 6 g/dL — ABNORMAL LOW (ref 6.5–8.1)

## 2018-11-02 LAB — CBC WITH DIFFERENTIAL/PLATELET
Abs Immature Granulocytes: 0.04 10*3/uL (ref 0.00–0.07)
BASOS ABS: 0 10*3/uL (ref 0.0–0.1)
BASOS PCT: 0 %
EOS ABS: 0.2 10*3/uL (ref 0.0–0.5)
Eosinophils Relative: 2 %
HCT: 44.3 % (ref 39.0–52.0)
Hemoglobin: 14.3 g/dL (ref 13.0–17.0)
IMMATURE GRANULOCYTES: 0 %
Lymphocytes Relative: 15 %
Lymphs Abs: 1.4 10*3/uL (ref 0.7–4.0)
MCH: 30 pg (ref 26.0–34.0)
MCHC: 32.3 g/dL (ref 30.0–36.0)
MCV: 93.1 fL (ref 80.0–100.0)
Monocytes Absolute: 0.9 10*3/uL (ref 0.1–1.0)
Monocytes Relative: 9 %
NEUTROS PCT: 74 %
NRBC: 0 % (ref 0.0–0.2)
Neutro Abs: 7.2 10*3/uL (ref 1.7–7.7)
Platelets: 250 10*3/uL (ref 150–400)
RBC: 4.76 MIL/uL (ref 4.22–5.81)
RDW: 13 % (ref 11.5–15.5)
WBC: 9.9 10*3/uL (ref 4.0–10.5)

## 2018-11-02 LAB — GLUCOSE, CAPILLARY
Glucose-Capillary: 105 mg/dL — ABNORMAL HIGH (ref 70–99)
Glucose-Capillary: 122 mg/dL — ABNORMAL HIGH (ref 70–99)
Glucose-Capillary: 74 mg/dL (ref 70–99)
Glucose-Capillary: 79 mg/dL (ref 70–99)
Glucose-Capillary: 82 mg/dL (ref 70–99)
Glucose-Capillary: 82 mg/dL (ref 70–99)
Glucose-Capillary: 85 mg/dL (ref 70–99)

## 2018-11-02 NOTE — Consult Note (Signed)
Physical Medicine and Rehabilitation Consult Reason for Consult:  Decreased functional mobility Referring Physician: [facility]   HPI: Cory Golden is a 62 y.o.right handed male with history of cocaine abuse on no scheduled medications. Presented 10/29/2018 [facility] after being found down in his RV. Per chart review patient lives alone in an RV. Reportedly independent prior to admission. Core temperature 85 as well as concern for seizure. He was intubated for airway protection. EKG showed evidence of atrial fibrillation. Chest x-ray showed infiltrate left lung. 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 He received 5 L of saline at [facility] and transferred to [facility] for further evaluation.UDS positive for cocaine.CT angiogram of head and neck negative for large vessel occlusion.MRI showed extensive acute infarcts within the cerebellum mostly affecting the right hemisphere and vermis, with minor involvement of the medial aspect of the left cerebellar hemisphere. No hemorrhage. No shift of hydrocephalus.EEG negative for seizure. Patient did not receive TPA.Echocardiogram with ejection fraction of 65% grade 1 diastolic dysfunction. MRSA PCR screening positive maintained on contact precautions.Currently maintained on aspirin for CVA prophylaxis. Subcutaneous Lovenox for DVT prophylaxis.Plan for TEE 11/03/2018. Regular consistency diet with nectar liquids. Therapy evaluations completed with recommendations of physical medicine rehabilitation consult.  Patient answers questions appropriately.  He states that his landlord can help him   Review of Systems  Constitutional: Negative for chills and fever.  HENT: Negative for hearing loss.   Eyes: Negative for blurred vision and double vision.  Respiratory: Positive for cough. Negative for shortness of breath.   Cardiovascular: Negative for chest pain and leg swelling.  Gastrointestinal: Positive for  constipation. Negative for nausea and vomiting.  Genitourinary: Negative for dysuria, flank pain and hematuria.  Musculoskeletal: Positive for myalgias.  Skin: Negative for rash.  Neurological: Positive for dizziness and focal weakness.  All other systems reviewed and are negative.  History reviewed. No pertinent past medical history. History reviewed. No pertinent surgical history. History reviewed. No pertinent family history. Social History:  has no tobacco, alcohol, and drug history on file. Allergies:  Allergies  Allergen Reactions  . Penicillins Rash   No medications prior to admission.    Home: Home Living Family/patient expects to be discharged to:: Unsure Living Arrangements: Alone Available Help at Discharge: Friend(s), Available PRN/intermittently Type of Home: Other(Comment)(RV) Home Access: Stairs to enter [employer] of Steps: 2 Entrance Stairs-Rails: Right, Left(rail) Home Layout: One level Bathroom Shower/Tub: Engineer, manufacturing systems: Standard Home Equipment: None Additional Comments: patient lives in RV with limited support  Functional History: Prior Function Level of Independence: Independent Comments: independent with ADLs and mobility Functional Status:  Mobility: Bed Mobility Overal bed mobility: Needs Assistance Bed Mobility: Supine to Sit Supine to sit: Min guard General bed mobility comments: seated OOB in recliner upon entry Transfers Overall transfer level: Needs assistance Equipment used: None Transfers: Sit to/from Stand Sit to Stand: Min guard, Min assist General transfer comment: used legs against the bed frame for stability.  Min assist once up in stance. Ambulation/Gait Ambulation/Gait assistance: Min assist, Mod assist Gait Distance (Feet): 140 Feet(x2 with stop and recovery due to fatigue degradation) Assistive device: None Gait Pattern/deviations: Step-through pattern, Ataxic, Wide base of support General  Gait Details: Mild ataxia, uncoordinated steps with degradation and circumduction on the L LE.  Drift and mild staggering with scanning. Gait velocity interpretation: <1.8 ft/sec, indicate of risk for recurrent falls    ADL: ADL Overall ADL's : Needs assistance/impaired Eating/Feeding:  Set up, Sitting Grooming: Minimal assistance, Sitting Upper Body Bathing: Minimal assistance, Sitting Lower Body Bathing: Moderate assistance, Sit to/from stand Upper Body Dressing : Minimal assistance, Sitting Lower Body Dressing: Moderate assistance, Sit to/from stand Toilet Transfer: Moderate assistance, +2 for safety/equipment, Ambulation, Grab bars, Regular Toilet Toileting- Clothing Manipulation and Hygiene: Moderate assistance, +2 for safety/equipment, Sit to/from stand Functional mobility during ADLs: Moderate assistance, +2 for safety/equipment, Cueing for safety, Cueing for sequencing General ADL Comments: pt limited by impaired coordination and balance, decreased safety awareness and impulsivity  Cognition: Cognition Overall Cognitive Status: Impaired/Different from baseline(not formally tested today. generally oriented) Orientation Level: Oriented X4 Cognition Arousal/Alertness: Awake/alert Behavior During Therapy: WFL for tasks assessed/performed, Impulsive Overall Cognitive Status: Impaired/Different from baseline(not formally tested today. generally oriented) Area of Impairment: Attention, Following commands, Memory, Safety/judgement, Awareness, Problem solving Current Attention Level: Sustained Memory: Decreased recall of precautions, Decreased short-term memory Following Commands: Follows multi-step commands inconsistently, Follows multi-step commands with increased time Safety/Judgement: Decreased awareness of safety, Decreased awareness of deficits Awareness: Emergent Problem Solving: Slow processing, Difficulty sequencing, Requires verbal cues, Requires tactile cues  Blood pressure  (!) 140/99, pulse 75, temperature 97.8 F (36.6 C), temperature source Oral, resp. rate (!) 23, height 6' (1.829 m), weight 64.2 kg, SpO2 99 %. Physical Exam  Vitals reviewed. Constitutional: He is oriented to person, place, and time.  HENT:  Head: Normocephalic.  Poor dentition  Eyes: EOM are normal.  Neck: Normal range of motion. Neck supple. No thyromegaly present.  Cardiovascular:  Cardiac rate controlled  Respiratory: Effort normal and breath sounds normal. No respiratory distress.  GI: Soft. Bowel sounds are normal. He exhibits no distension.  Neurological: He is alert and oriented to person, place, and time.  Motor strength is 5/5 bilateral deltoid bicep tricep hip flexor knee extensor ankle dorsiflexor Hand flexion extension limited by multiple Dupuytren's contractures Intact finger-nose-finger bilaterally intact heel-to-shin Sensation intact light touch bilateral upper lower limbs  Results for orders placed or performed during the hospital encounter of 10/29/18 (from the past 24 hour(s))  Glucose, capillary     Status: Abnormal   Collection Time: 11/01/18  3:32 PM  Result Value Ref Range   Glucose-Capillary 102 (H) 70 - 99 mg/dL   Comment 1 Notify RN    Comment 2 Document in Chart   Glucose, capillary     Status: Abnormal   Collection Time: 11/01/18  7:46 PM  Result Value Ref Range   Glucose-Capillary 122 (H) 70 - 99 mg/dL  Glucose, capillary     Status: None   Collection Time: 11/01/18 11:58 PM  Result Value Ref Range   Glucose-Capillary 79 70 - 99 mg/dL  Glucose, capillary     Status: None   Collection Time: 11/02/18  3:48 AM  Result Value Ref Range   Glucose-Capillary 82 70 - 99 mg/dL  CBC with Differential/Platelet     Status: None   Collection Time: 11/02/18  5:39 AM  Result Value Ref Range   WBC 9.9 4.0 - 10.5 K/uL   RBC 4.76 4.22 - 5.81 MIL/uL   Hemoglobin 14.3 13.0 - 17.0 g/dL   HCT 16.1 09.6 - 04.5 %   MCV 93.1 80.0 - 100.0 fL   MCH 30.0 26.0 - 34.0 pg    MCHC 32.3 30.0 - 36.0 g/dL   RDW 40.9 81.1 - 91.4 %   Platelets 250 150 - 400 K/uL   nRBC 0.0 0.0 - 0.2 %   Neutrophils Relative % 74 %  Neutro Abs 7.2 1.7 - 7.7 K/uL   Lymphocytes Relative 15 %   Lymphs Abs 1.4 0.7 - 4.0 K/uL   Monocytes Relative 9 %   Monocytes Absolute 0.9 0.1 - 1.0 K/uL   Eosinophils Relative 2 %   Eosinophils Absolute 0.2 0.0 - 0.5 K/uL   Basophils Relative 0 %   Basophils Absolute 0.0 0.0 - 0.1 K/uL   Immature Granulocytes 0 %   Abs Immature Granulocytes 0.04 0.00 - 0.07 K/uL  Comprehensive metabolic panel     Status: Abnormal   Collection Time: 11/02/18  5:39 AM  Result Value Ref Range   Sodium 139 135 - 145 mmol/L   Potassium 3.6 3.5 - 5.1 mmol/L   Chloride 107 98 - 111 mmol/L   CO2 25 22 - 32 mmol/L   Glucose, Bld 98 70 - 99 mg/dL   BUN 5 (L) 8 - 23 mg/dL   Creatinine, Ser 1.610.89 0.61 - 1.24 mg/dL   Calcium 8.6 (L) 8.9 - 10.3 mg/dL   Total Protein 6.0 (L) 6.5 - 8.1 g/dL   Albumin 2.5 (L) 3.5 - 5.0 g/dL   AST 096338 (H) 15 - 41 U/L   ALT 111 (H) 0 - 44 U/L   Alkaline Phosphatase 58 38 - 126 U/L   Total Bilirubin 1.0 0.3 - 1.2 mg/dL   GFR calc non Af Amer >60 >60 mL/min   GFR calc Af Amer >60 >60 mL/min   Anion gap 7 5 - 15  Glucose, capillary     Status: Abnormal   Collection Time: 11/02/18  8:26 AM  Result Value Ref Range   Glucose-Capillary 105 (H) 70 - 99 mg/dL   Comment 1 Notify RN    Comment 2 Document in Chart   Glucose, capillary     Status: None   Collection Time: 11/02/18 12:05 PM  Result Value Ref Range   Glucose-Capillary 85 70 - 99 mg/dL   Comment 1 Notify RN    Comment 2 Document in Chart    Dg Swallowing Func-speech Pathology  Result Date: 11/01/2018 Objective Swallowing Evaluation: Type of Study: MBS-Modified Barium Swallow Study  Patient Details Name: Cory Golden MRN: [id number] Date of Birth: 09/07/1956 Today's Date: 11/01/2018 Time: SLP Start Time (ACUTE ONLY): 1426 -SLP Stop Time (ACUTE ONLY): 1438 SLP Time Calculation (min)  (ACUTE ONLY): 12 min Past Medical History: No past medical history on file. Past Surgical History: No past surgical history on file. HPI: 62 year old male with a history of cocaine abuse was found down around 2 AM on October 29, 2018 in his RV.  His core temperature by EMS was 85 degrees and there was concern for seizure activity.  [facility] was [facility] and intubated for airway protection.  There he received warm saline, warm blankets.  His EKG showed evidence of atrial fibrillation, a chest x-ray showed an infiltrate in the left lung, and a head CT showed an acute to subacute right cerebellar stroke. CXR 11/18: "COPD with progression of bibasilar atelectasis/infiltrate"  MRI 11/18:  Subjective: pt alert, cooperative Assessment / Plan / Recommendation CHL IP CLINICAL IMPRESSIONS 11/01/2018 Clinical Impression  Pt has a mild-moderate oropharyngeal dysphagia primarily impacting liquid consumption. He has appropriate oropharyngeal clearance and airway protection with solids. With liquids he has more trouble maintaining a cohesive bolus, and liquids end up spilling prematurely into the pharynx. There is reduced hyolaryngeal movement, base of tongue retraction, and epiglottic inversion, which squeezes the liquids that reach the pyriform sinuses before the  swallow into the laryngeal vestibule. Although pt did seem to adequately clear his airway across the study either, either during the same swallow or upon subsequent swallows, there were times when thin liquids would reach and even possibly go on the underside of the vocal folds prior to being ejected. Moderate residue also remained in the valleculae and pyriform sinuses with liquids, which he reduced with a spontaneous second swallow while maintaining good airway protection. A chin tuck seemed to limit how deep penetration reached during the swallow, but it did not eliminate it. Given risk for aspiration across the course of a meal, particularly with pt's  impulsivity, recommend continuing current diet (regular diet, nectar thick liquids) with SLP f/u ideally during a meal to assess for potential to advance.  SLP Visit Diagnosis Dysphagia, oropharyngeal phase (R13.12) Attention and concentration deficit following -- Frontal lobe and executive function deficit following -- Impact on safety and function Mild aspiration risk;Moderate aspiration risk   CHL IP TREATMENT RECOMMENDATION 11/01/2018 Treatment Recommendations Therapy as outlined in treatment plan below   Prognosis 11/01/2018 Prognosis for Safe Diet Advancement Good Barriers to Reach Goals -- Barriers/Prognosis Comment -- CHL IP DIET RECOMMENDATION 11/01/2018 SLP Diet Recommendations Regular solids;Nectar thick liquid Liquid Administration via Cup;Straw Medication Administration Whole meds with puree Compensations Slow rate;Small sips/bites Postural Changes Seated upright at 90 degrees   CHL IP OTHER RECOMMENDATIONS 11/01/2018 Recommended Consults -- Oral Care Recommendations Oral care BID Other Recommendations --   CHL IP FOLLOW UP RECOMMENDATIONS 11/01/2018 Follow up Recommendations Inpatient Rehab   CHL IP FREQUENCY AND DURATION 11/01/2018 Speech Therapy Frequency (ACUTE ONLY) min 2x/week Treatment Duration 2 weeks      CHL IP ORAL PHASE 11/01/2018 Oral Phase Impaired Oral - Pudding Teaspoon -- Oral - Pudding Cup -- Oral - Honey Teaspoon -- Oral - Honey Cup -- Oral - Nectar Teaspoon -- Oral - Nectar Cup Decreased bolus cohesion;Premature spillage Oral - Nectar Straw -- Oral - Thin Teaspoon -- Oral - Thin Cup Decreased bolus cohesion;Premature spillage Oral - Thin Straw Decreased bolus cohesion;Premature spillage Oral - Puree WFL Oral - Mech Soft WFL Oral - Regular -- Oral - Multi-Consistency -- Oral - Pill Reduced posterior propulsion;Other (Comment) Oral Phase - Comment --  CHL IP PHARYNGEAL PHASE 11/01/2018 Pharyngeal Phase Impaired Pharyngeal- Pudding Teaspoon -- Pharyngeal -- Pharyngeal- Pudding Cup --  Pharyngeal -- Pharyngeal- Honey Teaspoon -- Pharyngeal -- Pharyngeal- Honey Cup -- Pharyngeal -- Pharyngeal- Nectar Teaspoon -- Pharyngeal -- Pharyngeal- Nectar Cup Reduced epiglottic inversion;Reduced anterior laryngeal mobility;Reduced laryngeal elevation;Reduced tongue base retraction;Penetration/Aspiration during swallow;Pharyngeal residue - valleculae Pharyngeal Material enters airway, remains ABOVE vocal cords then ejected out Pharyngeal- Nectar Straw -- Pharyngeal -- Pharyngeal- Thin Teaspoon -- Pharyngeal -- Pharyngeal- Thin Cup Reduced epiglottic inversion;Reduced anterior laryngeal mobility;Reduced laryngeal elevation;Reduced tongue base retraction;Penetration/Aspiration during swallow;Pharyngeal residue - valleculae;Penetration/Aspiration before swallow;Pharyngeal residue - pyriform Pharyngeal Material enters airway, CONTACTS cords and then ejected out;Material enters airway, passes BELOW cords then ejected out Pharyngeal- Thin Straw Reduced epiglottic inversion;Reduced anterior laryngeal mobility;Reduced laryngeal elevation;Reduced tongue base retraction;Penetration/Aspiration during swallow;Pharyngeal residue - valleculae;Penetration/Aspiration before swallow;Pharyngeal residue - pyriform Pharyngeal Material enters airway, CONTACTS cords and then ejected out;Material enters airway, passes BELOW cords then ejected out Pharyngeal- Puree Reduced epiglottic inversion;Reduced anterior laryngeal mobility;Reduced laryngeal elevation;Reduced airway/laryngeal closure;Reduced tongue base retraction Pharyngeal -- Pharyngeal- Mechanical Soft Reduced epiglottic inversion;Reduced anterior laryngeal mobility;Reduced laryngeal elevation;Reduced airway/laryngeal closure;Reduced tongue base retraction Pharyngeal -- Pharyngeal- Regular -- Pharyngeal -- Pharyngeal- Multi-consistency -- Pharyngeal -- Pharyngeal- Pill Reduced epiglottic inversion;Reduced anterior laryngeal  mobility;Reduced laryngeal elevation;Reduced  airway/laryngeal closure;Reduced tongue base retraction Pharyngeal -- Pharyngeal Comment --  CHL IP CERVICAL ESOPHAGEAL PHASE 11/01/2018 Cervical Esophageal Phase WFL Pudding Teaspoon -- Pudding Cup -- Honey Teaspoon -- Honey Cup -- Nectar Teaspoon -- Nectar Cup -- Nectar Straw -- Thin Teaspoon -- Thin Cup -- Thin Straw -- Puree -- Mechanical Soft -- Regular -- Multi-consistency -- Pill -- Cervical Esophageal Comment -- Maxcine Ham 11/01/2018, 3:15 PM  Maxcine Ham, M.A. CCC-SLP Acute Rehabilitation Services Pager [phone] Office [phone]             Vas [place] Lower Extremity Venous (dvt)  Result Date: 11/01/2018  Lower Venous Study Indications: Embolic stroke.  Limitations: Body habitus and shadowing from adjacent arterial calcifications. Performing Technologist: Annamaria Helling  Examination Guidelines: A complete evaluation includes B-mode imaging, spectral Doppler, color Doppler, and power Doppler as needed of all accessible portions of each vessel. Bilateral testing is considered an integral part of a complete examination. Limited examinations for reoccurring indications may be performed as noted.  Right Venous Findings: +---------+---------------+---------+-----------+----------+-------+          CompressibilityPhasicitySpontaneityPropertiesSummary +---------+---------------+---------+-----------+----------+-------+ CFV      Full           Yes      Yes                          +---------+---------------+---------+-----------+----------+-------+ SFJ      Full                                                 +---------+---------------+---------+-----------+----------+-------+ FV Prox  Full                                                 +---------+---------------+---------+-----------+----------+-------+ FV Mid   Full                                                 +---------+---------------+---------+-----------+----------+-------+ FV DistalFull                                                  +---------+---------------+---------+-----------+----------+-------+ PFV      Full                                                 +---------+---------------+---------+-----------+----------+-------+ POP      Full           Yes      Yes                          +---------+---------------+---------+-----------+----------+-------+ PTV      Full                                                 +---------+---------------+---------+-----------+----------+-------+  PERO     Full                                                 +---------+---------------+---------+-----------+----------+-------+  Left Venous Findings: +---------+---------------+---------+-----------+----------+-------+          CompressibilityPhasicitySpontaneityPropertiesSummary +---------+---------------+---------+-----------+----------+-------+ CFV      Full           Yes      Yes                          +---------+---------------+---------+-----------+----------+-------+ SFJ      Full                                                 +---------+---------------+---------+-----------+----------+-------+ FV Prox  Full                                                 +---------+---------------+---------+-----------+----------+-------+ FV Mid   Full                                                 +---------+---------------+---------+-----------+----------+-------+ FV DistalFull                                                 +---------+---------------+---------+-----------+----------+-------+ PFV      Full                                                 +---------+---------------+---------+-----------+----------+-------+ POP      Full           Yes      Yes                          +---------+---------------+---------+-----------+----------+-------+ PTV      Full                                                  +---------+---------------+---------+-----------+----------+-------+ PERO     Full                                                 +---------+---------------+---------+-----------+----------+-------+    Summary: Right: There is no evidence of deep vein thrombosis in the lower extremity. No cystic structure found in the popliteal fossa. Left: There is no evidence of deep vein thrombosis in the lower extremity. No cystic structure found in the popliteal fossa.  *  See table(s) above for measurements and observations. Electronically signed by Waverly Ferrari MD on 11/01/2018 at 4:05:57 AM.    Final      Assessment/Plan: Diagnosis: Cardia about stroke causing cerebellar infarct and mainly truncal ataxia. 1. Does the need for close, 24 hr/day medical supervision in concert with the patient's rehab needs make it unreasonable for this patient to be served in a less intensive setting? Yes 2. Co-Morbidities requiring supervision/potential complications: History of cocaine abuse, COPD, pneumonia 3. Due to bladder management, bowel management, safety, disease management, medication administration, pain management and patient education, does the patient require 24 hr/day rehab nursing? Yes 4. Does the patient require coordinated care of a physician, rehab nurse, PT (1-2 hrs/day, 5 days/week) and OT (1-2 hrs/day, 5 days/week) to address physical and functional deficits in the context of the above medical diagnosis(es)? Yes Addressing deficits in the following areas: balance, endurance, locomotion, strength, transferring, bowel/bladder control, bathing, dressing, feeding, grooming, toileting and psychosocial support 5. Can the patient actively participate in an intensive therapy program of at least 3 hrs of therapy per day at least 5 days per week? Yes 6. The potential for patient to make measurable gains while on inpatient rehab is good 7. Anticipated functional outcomes upon discharge from inpatient rehab  are modified independent and supervision  with PT, modified independent and supervision with OT, n/a with SLP. 8. Estimated rehab length of stay to reach the above functional goals is: 7-10d 9. Anticipated D/C setting: Home 10. Anticipated post D/C treatments: HH therapy 11. Overall Rehab/Functional Prognosis: good  RECOMMENDATIONS: This patient's condition is appropriate for continued rehabilitative care in the following setting: CIR Patient has agreed to participate in recommended program. No Note that insurance prior authorization may be required for reimbursement for recommended care.  Comment: Patient states his dog has not been spent for couple days.  He states that his landlord cannot help him with that.  "I have personally performed a face to face diagnostic evaluation of this patient.  Additionally, I have reviewed and concur with the physician assistant's documentation above." Erick Colace M.D. [facility] FAAPM&R (Sports Med, Neuromuscular Med) Diplomate Am Board of Electrodiagnostic Med   Lynnae Prude 11/02/2018
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
## 2018-11-02 NOTE — Progress Notes (Signed)
OT Progress Note  Pt with improved mobility and coordination however continues to require min A with mobility and ADL. Feel pt can reach modified independent level goals with CIR. Will continue to follow acutely.     11/02/18 1400  OT Visit Information  Last OT Received On 11/02/18  Assistance Needed +2  PT/OT/SLP Co-Evaluation/Treatment Yes  Reason for Co-Treatment For patient/therapist safety  OT goals addressed during session ADL's and self-care  History of Present Illness Pt is a 62 year old male with a history of cocaine abuse was found down around 2 AM on October 29, 2018 in his RV with hypothermia and ?seizure activity. 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   Precautions  Precautions Fall  Pain Assessment  Pain Assessment No/denies pain  Cognition  Arousal/Alertness Awake/alert  Behavior During Therapy Impulsive  Overall Cognitive Status Impaired/Different from baseline (not formally tested today. generally oriented)  Area of Impairment Attention;Safety/judgement;Awareness  Current Attention Level Selective  Following Commands Follows one step commands consistently  Safety/Judgement Decreased awareness of safety;Decreased awareness of deficits  Awareness Emergent  Problem Solving Slow processing  Upper Extremity Assessment  RUE Deficits / Details dysmetric, but functional; strength WFL for ADL  LUE Deficits / Details dysmetric, using functionally; Strength WFL for ADL  Lower Extremity Assessment  Lower Extremity Assessment Defer to PT evaluation  ADL  Overall ADL's  Needs assistance/impaired  Grooming Minimal assistance;Standing  Upper Body Bathing Set up;Supervision/ safety;Sitting  Lower Body Bathing Minimal assistance;Sit to/from stand  Upper Body Dressing  Minimal assistance;Sitting  Lower Body Dressing Minimal assistance;Sit to/from Insurance risk surveyorstand  Toilet  Transfer Minimal assistance;Ambulation  Toileting- Clothing Manipulation and Hygiene Minimal assistance;Sit to/from stand  Functional mobility during ADLs Minimal assistance  Bed Mobility  Overal bed mobility Needs Assistance  Bed Mobility Supine to Sit  Supine to sit Min guard  Balance  Sitting-balance support No upper extremity supported;Feet supported  Sitting balance-Leahy Scale Good  Standing balance-Leahy Scale Poor (to poor)  Standing balance comment Significant LOB when distracted adn with dynamic tasks  Vision- Assessment  Additional Comments Able to identify targets however poor orientation in space; hitting shoulder on door when walking through; will further assess  Transfers  Overall transfer level Needs assistance  Equipment used None  Transfers Sit to/from Stand  Sit to Stand Min assist  General transfer comment used legs against the bed frame for stability.  Min assist once up in stance.  OT - End of Session  Equipment Utilized During Treatment Gait belt  Activity Tolerance Patient tolerated treatment well  Patient left in chair;with call bell/phone within reach;with chair alarm set  Nurse Communication Mobility status  OT Assessment/Plan  OT Plan Discharge plan remains appropriate  OT Visit Diagnosis Unsteadiness on feet (R26.81);Other abnormalities of gait and mobility (R26.89)  OT Frequency (ACUTE ONLY) Min 3X/week  Recommendations for Other Services Rehab consult  Follow Up Recommendations CIR  OT Equipment 3 in 1 bedside commode  AM-PAC OT "6 Clicks" Daily Activity Outcome Measure  Help from another person eating meals? 3  Help from another person taking care of personal grooming? 3  Help from another person toileting, which includes using toliet, bedpan, or urinal? 3  Help from another person bathing (including washing, rinsing, drying)? 3  Help from another person to put on and taking off regular upper body clothing? 3  Help from another person to put on  and taking off  regular lower body clothing? 3  6 Click Score 18  ADL G Code Conversion CK  OT Goal Progression  Progress towards OT goals Progressing toward goals  Acute Rehab OT Goals  Patient Stated Goal to get better  OT Goal Formulation With patient  Time For Goal Achievement 11/15/18  Potential to Achieve Goals Good  ADL Goals  Pt Will Perform Grooming with modified independence;standing  Pt Will Perform Upper Body Bathing sitting;with modified independence  Pt Will Perform Lower Body Dressing with modified independence;sit to/from stand  Pt Will Transfer to Toilet with modified independence;ambulating;regular height toilet;bedside commode  Pt/caregiver will Perform Home Exercise Program Both right and left upper extremity  OT Time Calculation  OT Start Time (ACUTE ONLY) 1141  OT Stop Time (ACUTE ONLY) 1204  OT Time Calculation (min) 23 min  OT General Charges  $OT Visit 1 Visit  OT Treatments  $Self Care/Home Management  8-22 mins  Luisa Dago, OT/L   Acute OT Clinical Specialist Acute Rehabilitation Services Pager [phone] Office [phone]

## 2018-11-03 ENCOUNTER — Other Ambulatory Visit: Payer: Self-pay

## 2018-11-03 ENCOUNTER — Inpatient Hospital Stay (HOSPITAL_COMMUNITY): Payer: Self-pay | Admitting: Certified Registered Nurse Anesthetist

## 2018-11-03 ENCOUNTER — Other Ambulatory Visit: Payer: Self-pay | Admitting: Medical

## 2018-11-03 ENCOUNTER — Encounter (HOSPITAL_COMMUNITY)
Admission: AD | Disposition: A | Discharge status: Home Health | Payer: Self-pay | Source: Other Acute Inpatient Hospital | Attending: Pulmonary Disease

## 2018-11-03 ENCOUNTER — Encounter (HOSPITAL_COMMUNITY): Payer: Self-pay | Admitting: *Deleted

## 2018-11-03 ENCOUNTER — Inpatient Hospital Stay (HOSPITAL_COMMUNITY): Payer: Self-pay

## 2018-11-03 DIAGNOSIS — I639 Cerebral infarction, unspecified: Secondary | ICD-10-CM

## 2018-11-03 DIAGNOSIS — F1410 Cocaine abuse, uncomplicated: Secondary | ICD-10-CM

## 2018-11-03 DIAGNOSIS — E43 Unspecified severe protein-calorie malnutrition: Secondary | ICD-10-CM

## 2018-11-03 HISTORY — PX: TEE WITHOUT CARDIOVERSION: SHX5443

## 2018-11-03 LAB — CULTURE, BLOOD (ROUTINE X 2)
CULTURE: NO GROWTH
CULTURE: NO GROWTH
Special Requests: ADEQUATE
Special Requests: ADEQUATE

## 2018-11-03 LAB — COMPREHENSIVE METABOLIC PANEL
ALT: 102 U/L — ABNORMAL HIGH (ref 0–44)
AST: 247 U/L — AB (ref 15–41)
Albumin: 2.5 g/dL — ABNORMAL LOW (ref 3.5–5.0)
Alkaline Phosphatase: 52 U/L (ref 38–126)
Anion gap: 8 (ref 5–15)
BILIRUBIN TOTAL: 0.4 mg/dL (ref 0.3–1.2)
BUN: 8 mg/dL (ref 8–23)
CO2: 27 mmol/L (ref 22–32)
CREATININE: 0.85 mg/dL (ref 0.61–1.24)
Calcium: 8.7 mg/dL — ABNORMAL LOW (ref 8.9–10.3)
Chloride: 105 mmol/L (ref 98–111)
GFR calc Af Amer: >60 mL/min (ref >60)
Glucose, Bld: 84 mg/dL (ref 70–99)
POTASSIUM: 3 mmol/L — AB (ref 3.5–5.1)
Sodium: 140 mmol/L (ref 135–145)
TOTAL PROTEIN: 6.1 g/dL — AB (ref 6.5–8.1)

## 2018-11-03 LAB — CBC WITH DIFFERENTIAL/PLATELET
Abs Immature Granulocytes: 0.08 10*3/uL — ABNORMAL HIGH (ref 0.00–0.07)
Basophils Absolute: 0 10*3/uL (ref 0.0–0.1)
Basophils Relative: 0 %
Eosinophils Absolute: 0.4 10*3/uL (ref 0.0–0.5)
Eosinophils Relative: 4 %
HEMATOCRIT: 44.5 % (ref 39.0–52.0)
Hemoglobin: 14.3 g/dL (ref 13.0–17.0)
IMMATURE GRANULOCYTES: 1 %
LYMPHS PCT: 20 %
Lymphs Abs: 1.9 10*3/uL (ref 0.7–4.0)
MCH: 29.9 pg (ref 26.0–34.0)
MCHC: 32.1 g/dL (ref 30.0–36.0)
MCV: 92.9 fL (ref 80.0–100.0)
MONOS PCT: 11 %
Monocytes Absolute: 1.1 10*3/uL — ABNORMAL HIGH (ref 0.1–1.0)
NEUTROS ABS: 6.3 10*3/uL (ref 1.7–7.7)
NEUTROS PCT: 64 %
Platelets: 284 10*3/uL (ref 150–400)
RBC: 4.79 MIL/uL (ref 4.22–5.81)
RDW: 13.1 % (ref 11.5–15.5)
WBC: 9.8 10*3/uL (ref 4.0–10.5)
nRBC: 0 % (ref 0.0–0.2)

## 2018-11-03 LAB — GLUCOSE, CAPILLARY: GLUCOSE-CAPILLARY: 90 mg/dL (ref 70–99)

## 2018-11-03 SURGERY — ECHOCARDIOGRAM, TRANSESOPHAGEAL
Anesthesia: Monitor Anesthesia Care

## 2018-11-03 MED ORDER — PROPOFOL 500 MG/50ML IV EMUL
INTRAVENOUS | Status: DC | PRN
  Administered 2018-11-03: 100 ug/kg/min via INTRAVENOUS

## 2018-11-03 MED ORDER — PROPOFOL 10 MG/ML IV BOLUS
INTRAVENOUS | Status: DC | PRN
  Administered 2018-11-03 (×2): 20 mg via INTRAVENOUS
  Administered 2018-11-03: 30 mg via INTRAVENOUS
  Administered 2018-11-03: 40 mg via INTRAVENOUS
  Administered 2018-11-03 (×2): 30 mg via INTRAVENOUS

## 2018-11-03 MED ORDER — LIDOCAINE 2% (20 MG/ML) 5 ML SYRINGE
INTRAMUSCULAR | Status: DC | PRN
  Administered 2018-11-03: 100 mg via INTRAVENOUS

## 2018-11-03 MED ORDER — POTASSIUM CHLORIDE CRYS ER 20 MEQ PO TBCR
40.0000 meq | EXTENDED_RELEASE_TABLET | ORAL | Status: DC
  Administered 2018-11-03: 40 meq via ORAL
  Filled 2018-11-03 (×2): qty 2

## 2018-11-03 MED ORDER — IPRATROPIUM-ALBUTEROL 0.5-2.5 (3) MG/3ML IN SOLN: 3.0000 mL | Freq: Four times a day (QID) | RESPIRATORY_TRACT | Status: DC | PRN

## 2018-11-03 MED ORDER — LACTATED RINGERS IV SOLN
INTRAVENOUS | Status: AC | PRN
  Administered 2018-11-03: 1000 mL via INTRAVENOUS

## 2018-11-03 MED ORDER — ASPIRIN 325 MG PO TABS: 325.0000 mg | ORAL_TABLET | Freq: Every day | ORAL | Status: AC

## 2018-11-03 NOTE — Progress Notes (Signed)
Occupational Therapy Treatment Patient Details Name: Cory Golden MRN: [id number] DOB: 1956-05-18 Today's Date: 11/03/2018    History of present illness Pt is a 62 year old male with a history of cocaine abuse was found down around 2 AM on October 29, 2018 in his RV with hypothermia and ?seizure activity. 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    OT comments  Session focused on home safety and reducing risk of falls given pt's apparent deficits with balance and below listed deficits. Pt verbalized understanding. Recommend HHOT.   Follow Up Recommendations  Home health OT;Supervision/Assistance - 24 hour(pt declining CIR)    Equipment Recommendations  3 in 1 bedside commode    Recommendations for Other Services Rehab consult    Precautions / Restrictions Precautions Precautions: Fall       Mobility Bed Mobility Overal bed mobility: Modified Independent                Transfers Overall transfer level: Needs assistance Equipment used: Rolling walker (2 wheeled)   Sit to Stand: Supervision              Balance             Standing balance-Leahy Scale: Poor                             ADL either performed or assessed with clinical judgement   ADL                                       Functional mobility during ADLs: Supervision/safety;Rolling walker General ADL Comments: Educated pt on home safety and reducing risk of falls. Recommend pt use 3 in 1 as shower chair. Recommend pt use RW for all mobility as pt demosntrates LOB, especially with dynamic tasks that challenge balance. Pt issued a reacher to reduce need to retrieve items from floor.      Vision       Perception     Praxis      Cognition Arousal/Alertness: Awake/alert Behavior During Therapy: Impulsive Overall Cognitive Status: Impaired/Different  from baseline                     Current Attention Level: Selective     Safety/Judgement: Decreased awareness of safety;Decreased awareness of deficits              Exercises     Shoulder Instructions       General Comments      Pertinent Vitals/ Pain       Pain Assessment: No/denies pain  Home Living Family/patient expects to be discharged to:: Private residence Living Arrangements: Alone                                      Prior Functioning/Environment              Frequency  Min 3X/week        Progress Toward Goals  OT Goals(current goals can now be found in the care plan section)  Progress towards OT goals: Progressing toward goals  Acute Rehab OT Goals Patient Stated Goal: to get better OT Goal Formulation: With patient Time For Goal Achievement:  11/15/18 Potential to Achieve Goals: Good ADL Goals Pt Will Perform Grooming: with modified independence;standing Pt Will Perform Upper Body Bathing: sitting;with modified independence Pt Will Perform Lower Body Dressing: with modified independence;sit to/from stand Pt Will Transfer to Toilet: with modified independence;ambulating;regular height toilet;bedside commode Pt/caregiver will Perform Home Exercise Program: Both right and left upper extremity  Plan Discharge plan needs to be updated    Co-evaluation                 AM-PAC PT "6 Clicks" Daily Activity     Outcome Measure   Help from another person eating meals?: A Little Help from another person taking care of personal grooming?: A Little Help from another person toileting, which includes using toliet, bedpan, or urinal?: A Little Help from another person bathing (including washing, rinsing, drying)?: A Little Help from another person to put on and taking off regular upper body clothing?: A Little Help from another person to put on and taking off regular lower body clothing?: A Little 6 Click Score: 18     End of Session Equipment Utilized During Treatment: Rolling walker  OT Visit Diagnosis: Unsteadiness on feet (R26.81);Other abnormalities of gait and mobility (R26.89)   Activity Tolerance Patient tolerated treatment well   Patient Left in bed;with call bell/phone within reach;with bed alarm set   Nurse Communication Mobility status;Other (comment)(BRB after suing the bathroom)        Time: [id number] OT Time Calculation (min): 17 min  Charges: OT General Charges $OT Visit: 1 Visit OT Treatments $Self Care/Home Management : 8-22 mins  Luisa DagoHilary Marian Grandt, OT/L   Acute OT Clinical Specialist Acute Rehabilitation Services Pager [phone] Office [phone]    [facility] IncWARD,HILLARY 11/03/2018, 4:24 PM

## 2018-11-03 NOTE — Progress Notes (Signed)
Pt discharged with friend.  All discharge paperwork was provided to patient.  Jaclyn ShaggySusie Malak Orantes RN

## 2018-11-03 NOTE — CV Procedure (Addendum)
    PROCEDURE NOTE:  Procedure:  Transesophageal echocardiogram Operator:  Armanda Magicraci Asante Blanda, MD Indications:  CVA and IV drug abuse Complications: None  During this procedure the patient is administered a total of Propofol 300 mg to achieve and maintain moderate conscious sedation.  The patient's heart rate, blood pressure, and oxygen saturation are monitored continuously during the procedure.  Results: Normal LV size and function with EF 60-65%.   Normal RV size and function Normal RA Normal LA and LA appendage Normal TV with trivial TR Normal PV Normal MV with trivial MR Normal trileaflet AV Lipomatous interatrial septum with thin hypermobile mid septal portion with no evidence of shunt by colorflow dopper and agitated saline contrast Normal thoracic and ascending aorta.  The patient tolerated the procedure well and was transferred back to their room in stable condition.  Signed: Armanda Magicraci Philisha Weinel, MD [facility]

## 2018-11-03 NOTE — Transfer of Care (Signed)
Immediate Anesthesia Transfer of Care Note  Patient: Cory Golden  Procedure(s) Performed: TRANSESOPHAGEAL ECHOCARDIOGRAM (TEE) (N/A ) BUBBLE STUDY  Patient Location: Endoscopy Unit  Anesthesia Type:MAC  Level of Consciousness: awake, alert  and oriented  Airway & Oxygen Therapy: Patient Spontanous Breathing and Patient connected to nasal cannula oxygen  Post-op Assessment: Report given to RN and Post -op Vital signs reviewed and stable  Post vital signs: Reviewed and stable  Last Vitals:  Vitals Value Taken Time  BP 111/67 11/03/2018  9:24 AM  Temp    Pulse 96 11/03/2018  9:24 AM  Resp 25 11/03/2018  9:24 AM  SpO2 96 % 11/03/2018  9:24 AM  Vitals shown include unvalidated device data.  Last Pain:  Vitals:   11/03/18 0807  TempSrc: Oral  PainSc: 0-No pain         Complications: No apparent anesthesia complications

## 2018-11-03 NOTE — Interval H&P Note (Signed)
History and Physical Interval Note:  11/03/2018 8:28 AM  Cory Golden  has presented today for surgery, with the diagnosis of STROKE  The various methods of treatment have been discussed with the patient and family. After consideration of risks, benefits and other options for treatment, the patient has consented to  Procedure(s): TRANSESOPHAGEAL ECHOCARDIOGRAM (TEE) (N/A) as a surgical intervention .  The patient's history has been reviewed, patient examined, no change in status, stable for surgery.  I have reviewed the patient's chart and labs.  Questions were answered to the patient's satisfaction.     Armanda Magicraci Glena Pharris

## 2018-11-03 NOTE — Progress Notes (Signed)
SLP Cancellation Note  Patient Details Name: Cory Golden MRN: [id number] DOB: 06/27/1956   Cancelled treatment:       Reason Eval/Treat Not Completed: Patient at procedure or test/unavailable   [name], [name] 11/03/2018, 9:27 AM

## 2018-11-03 NOTE — Anesthesia Preprocedure Evaluation (Signed)
Anesthesia Evaluation  Patient identified by MRN, date of birth, ID band Patient awake    Reviewed: Allergy & Precautions, NPO status , Patient's Chart, lab work & pertinent test results  Airway Mallampati: II  TM Distance: >3 FB     Dental  (+) Dental Advisory Given, Edentulous Upper, Edentulous Lower   Pulmonary neg pulmonary ROS,    breath sounds clear to auscultation       Cardiovascular negative cardio ROS   Rhythm:Regular Rate:Normal     Neuro/Psych CVA    GI/Hepatic negative GI ROS, Neg liver ROS,   Endo/Other  negative endocrine ROS  Renal/GU negative Renal ROS     Musculoskeletal   Abdominal   Peds  Hematology negative hematology ROS (+)   Anesthesia Other Findings   Reproductive/Obstetrics                             Anesthesia Physical Anesthesia Plan  ASA: III  Anesthesia Plan: MAC   Post-op Pain Management:    Induction: Intravenous  PONV Risk Score and Plan: 1 and Propofol infusion, Ondansetron and Treatment may vary due to age or medical condition  Airway Management Planned: Natural Airway and Nasal Cannula  Additional Equipment:   Intra-op Plan:   Post-operative Plan:   Informed Consent: I have reviewed the patients History and Physical, chart, labs and discussed the procedure including the risks, benefits and alternatives for the proposed anesthesia with the patient or authorized representative who has indicated his/her understanding and acceptance.     Plan Discussed with: CRNA  Anesthesia Plan Comments:         Anesthesia Quick Evaluation

## 2018-11-03 NOTE — Discharge Summary (Signed)
Physician Discharge Summary         Patient ID: San Lohmeyer MRN: [id number] DOB/AGE: 1956/01/30 62 y.o.  Admit date: 10/29/2018 Discharge date: 11/03/2018  Discharge Diagnoses:   Stroke, multifocal infarcts involving the right cerebellum, left cerebellar, right MCA/PCA and bilateral ACAs as well as punctate left MCA-> presumed embolic Pneumonia Possible atrial fibrillation Sepsis Cocaine abuse Acute kidney injury  Discharge summary    62 year old male patient who was found altered around 2 AM in his RV on November 17.  His core temperature was 85 degrees, there was concern for possible seizure activity.  His initial EKG showed evidence of atrial fibrillation by EMS.  He was initially admitted to [facility], required norepinephrine for persistent hypotension in spite of volume resuscitation efforts.  He was intubated for airway protection.  He was transferred to [facility] for further evaluation. Initial CT of head showed subacute/acute right cerebellar stroke.  Initial white blood cell count 16.8.  He was treated with empiric antibiotics, IV hydration, telemetry monitoring, and supportive care.  Neurology consultation was obtained due to initial concern for cerebrovascular accident.  Further evaluation demonstrated multifocal strokes as mentioned above in the discharge summary diagnosis. Transesophageal  Echocardiogram was negative for endocarditis or PFO. -UDS was positive for cocaine -EEG was negative for seizures -Lower extremity Doppler negative for DVT -Was started on aspirin -Antibiotics converted to p.o. Levaquin -Ongoing telemetry has been negative for atrial fibrillation -Shock resolved As of 11/22 he had improved to the point where we felt he should be discharged to rehab.  He declined this, desired to be discharge to home with his friend.  He will be discharged to home with the following plan of care as outlined below  Discharge Plan by Active Problems    Multifocal  CVA -Presumed secondary to atrial fibrillation, however we have not been able to identify this on telemetry Plan Home on aspirin F/u with neurology Setting up 30d cardiac event monitoring  Has f/u w/ neurology  Pneumonia  Plan Completed course   Cocaine abuse Plan No more drugs        Significant Hospital tests/ studies   MRI multifocal infarcts largest at right cerebllum, but also involving left cerebellar, right MCA/PCA and MCA/ACA, b/l ACAs and punctate left MCA  CTA head and neck - unremarkable except left VA proximal stenosis, no mycotic aneurysm  2D Echo EF 60-65%  TEE unremarkable, no PFO Procedures   oett 11/17>>>11/19 Culture data/antimicrobials      Consults    stroke team  Discharge Exam: Blood Pressure 127/67   Pulse 100   Temperature (Abnormal) 97.5 F (36.4 C) (Oral) Comment: offered more blankets  Respiration 16   Height 6' (1.829 m)   Weight 64 kg   Oxygen Saturation 100%   Body Mass Index 19.14 kg/m   General pleasant 62 year old male no distress HENT NCAT no JVD MMM Pulm Clear w/out accessory use Card RRR w/out MRG abd soft not tender + bowel sounds no OM GU voids  Neuro oriented. Some ataxia w/ gait otherwise no focal def  Labs at discharge   Lab Results  Component Value Date   CREATININE 0.85 11/03/2018   BUN 8 11/03/2018   NA 140 11/03/2018   K 3.0 (L) 11/03/2018   CL 105 11/03/2018   CO2 27 11/03/2018   Lab Results  Component Value Date   WBC 9.8 11/03/2018   HGB 14.3 11/03/2018   HCT 44.5 11/03/2018   MCV 92.9 11/03/2018  PLT 284 11/03/2018   Lab Results  Component Value Date   ALT 102 (H) 11/03/2018   AST 247 (H) 11/03/2018   ALKPHOS 52 11/03/2018   BILITOT 0.4 11/03/2018   No results found for: INR, PROTIME  Current radiological studies    Dg Swallowing Func-speech Pathology  Result Date: 11/01/2018 Objective Swallowing Evaluation: Type of Study: MBS-Modified Barium Swallow Study  Patient Details Name:  Cory Golden MRN: [id number] Date of Birth: 01/01/1956 Today's Date: 11/01/2018 Time: SLP Start Time (ACUTE ONLY): 1426 -SLP Stop Time (ACUTE ONLY): 1438 SLP Time Calculation (min) (ACUTE ONLY): 12 min Past Medical History: No past medical history on file. Past Surgical History: No past surgical history on file. HPI: 62 year old male with a history of cocaine abuse was found down around 2 AM on October 29, 2018 in his RV.  His core temperature by EMS was 85 degrees and there was concern for seizure activity.  [facility] and intubated for airway protection.  There he received warm saline, warm blankets.  His EKG showed evidence of atrial fibrillation, a chest x-ray showed an infiltrate in the left lung, and a head CT showed an acute to subacute right cerebellar stroke. CXR 11/18: "COPD with progression of bibasilar atelectasis/infiltrate"  MRI 11/18:  Subjective: pt alert, cooperative Assessment / Plan / Recommendation CHL IP CLINICAL IMPRESSIONS 11/01/2018 Clinical Impression  Pt has a mild-moderate oropharyngeal dysphagia primarily impacting liquid consumption. He has appropriate oropharyngeal clearance and airway protection with solids. With liquids he has more trouble maintaining a cohesive bolus, and liquids end up spilling prematurely into the pharynx. There is reduced hyolaryngeal movement, base of tongue retraction, and epiglottic inversion, which squeezes the liquids that reach the pyriform sinuses before the swallow into the laryngeal vestibule. Although pt did seem to adequately clear his airway across the study either, either during the same swallow or upon subsequent swallows, there were times when thin liquids would reach and even possibly go on the underside of the vocal folds prior to being ejected. Moderate residue also remained in the valleculae and pyriform sinuses with liquids, which he reduced with a spontaneous second swallow while maintaining good airway protection. A chin  tuck seemed to limit how deep penetration reached during the swallow, but it did not eliminate it. Given risk for aspiration across the course of a meal, particularly with pt's impulsivity, recommend continuing current diet (regular diet, nectar thick liquids) with SLP f/u ideally during a meal to assess for potential to advance.  SLP Visit Diagnosis Dysphagia, oropharyngeal phase (R13.12) Attention and concentration deficit following -- Frontal lobe and executive function deficit following -- Impact on safety and function Mild aspiration risk;Moderate aspiration risk   CHL IP TREATMENT RECOMMENDATION 11/01/2018 Treatment Recommendations Therapy as outlined in treatment plan below   Prognosis 11/01/2018 Prognosis for Safe Diet Advancement Good Barriers to Reach Goals -- Barriers/Prognosis Comment -- CHL IP DIET RECOMMENDATION 11/01/2018 SLP Diet Recommendations Regular solids;Nectar thick liquid Liquid Administration via Cup;Straw Medication Administration Whole meds with puree Compensations Slow rate;Small sips/bites Postural Changes Seated upright at 90 degrees   CHL IP OTHER RECOMMENDATIONS 11/01/2018 Recommended Consults -- Oral Care Recommendations Oral care BID Other Recommendations --   CHL IP FOLLOW UP RECOMMENDATIONS 11/01/2018 Follow up Recommendations Inpatient Rehab   CHL IP FREQUENCY AND DURATION 11/01/2018 Speech Therapy Frequency (ACUTE ONLY) min 2x/week Treatment Duration 2 weeks      CHL IP ORAL PHASE 11/01/2018 Oral Phase Impaired Oral - Pudding Teaspoon -- Oral -  Pudding Cup -- Oral - Honey Teaspoon -- Oral - Honey Cup -- Oral - Nectar Teaspoon -- Oral - Nectar Cup Decreased bolus cohesion;Premature spillage Oral - Nectar Straw -- Oral - Thin Teaspoon -- Oral - Thin Cup Decreased bolus cohesion;Premature spillage Oral - Thin Straw Decreased bolus cohesion;Premature spillage Oral - Puree WFL Oral - Mech Soft WFL Oral - Regular -- Oral - Multi-Consistency -- Oral - Pill Reduced posterior  propulsion;Other (Comment) Oral Phase - Comment --  CHL IP PHARYNGEAL PHASE 11/01/2018 Pharyngeal Phase Impaired Pharyngeal- Pudding Teaspoon -- Pharyngeal -- Pharyngeal- Pudding Cup -- Pharyngeal -- Pharyngeal- Honey Teaspoon -- Pharyngeal -- Pharyngeal- Honey Cup -- Pharyngeal -- Pharyngeal- Nectar Teaspoon -- Pharyngeal -- Pharyngeal- Nectar Cup Reduced epiglottic inversion;Reduced anterior laryngeal mobility;Reduced laryngeal elevation;Reduced tongue base retraction;Penetration/Aspiration during swallow;Pharyngeal residue - valleculae Pharyngeal Material enters airway, remains ABOVE vocal cords then ejected out Pharyngeal- Nectar Straw -- Pharyngeal -- Pharyngeal- Thin Teaspoon -- Pharyngeal -- Pharyngeal- Thin Cup Reduced epiglottic inversion;Reduced anterior laryngeal mobility;Reduced laryngeal elevation;Reduced tongue base retraction;Penetration/Aspiration during swallow;Pharyngeal residue - valleculae;Penetration/Aspiration before swallow;Pharyngeal residue - pyriform Pharyngeal Material enters airway, CONTACTS cords and then ejected out;Material enters airway, passes BELOW cords then ejected out Pharyngeal- Thin Straw Reduced epiglottic inversion;Reduced anterior laryngeal mobility;Reduced laryngeal elevation;Reduced tongue base retraction;Penetration/Aspiration during swallow;Pharyngeal residue - valleculae;Penetration/Aspiration before swallow;Pharyngeal residue - pyriform Pharyngeal Material enters airway, CONTACTS cords and then ejected out;Material enters airway, passes BELOW cords then ejected out Pharyngeal- Puree Reduced epiglottic inversion;Reduced anterior laryngeal mobility;Reduced laryngeal elevation;Reduced airway/laryngeal closure;Reduced tongue base retraction Pharyngeal -- Pharyngeal- Mechanical Soft Reduced epiglottic inversion;Reduced anterior laryngeal mobility;Reduced laryngeal elevation;Reduced airway/laryngeal closure;Reduced tongue base retraction Pharyngeal -- Pharyngeal- Regular --  Pharyngeal -- Pharyngeal- Multi-consistency -- Pharyngeal -- Pharyngeal- Pill Reduced epiglottic inversion;Reduced anterior laryngeal mobility;Reduced laryngeal elevation;Reduced airway/laryngeal closure;Reduced tongue base retraction Pharyngeal -- Pharyngeal Comment --  CHL IP CERVICAL ESOPHAGEAL PHASE 11/01/2018 Cervical Esophageal Phase WFL Pudding Teaspoon -- Pudding Cup -- Honey Teaspoon -- Honey Cup -- Nectar Teaspoon -- Nectar Cup -- Nectar Straw -- Thin Teaspoon -- Thin Cup -- Thin Straw -- Puree -- Mechanical Soft -- Regular -- Multi-consistency -- Pill -- Cervical Esophageal Comment -- Maxcine Ham 11/01/2018, 3:15 PM  Maxcine Ham, M.A. CCC-SLP Acute Rehabilitation Services Pager [phone] Office [phone]              Disposition:    Discharge disposition: 01-Home or Self Care    home   Discharge Instructions    Ambulatory referral to Neurology   Complete by:  As directed    Follow up with stroke clinic NP (Jessica Vanschaick or Darrol Angel, if both not available, consider Manson Allan, or [facility]) at [facility] in about 4 weeks. Thanks.   Diet - low sodium heart healthy   Complete by:  As directed    Increase activity slowly   Complete by:  As directed       Allergies as of 11/03/2018    Allergen Reactions Comment   Penicillins Rash       Medication List    Take these medications   aspirin 325 MG tablet Take 1 tablet (325 mg total) by mouth daily. Start taking on:  11/04/2018        Durable Medical Equipment  (From admission, onward)         Start     Ordered   11/03/18 1402  For home use only DME Walker rolling  Once    Question:  Patient needs a walker to treat with the following condition  Answer:  Stroke (cerebrum) (HCC)   11/03/18 1403   11/03/18 1402  For home use only DME Bedside commode  Once    Question:  Patient needs a bedside commode to treat with the following condition  Answer:  Stroke (cerebrum) (HCC)   11/03/18 1403             Follow-up appointment   Stroke/neurology follow up Cards to set up walking oximetry  Discharge Condition:   Stable  Physician Statement:   The Patient was personally examined, the discharge assessment and plan has been personally reviewed and I agree with ACNP Joselito Fieldhouse's assessment and plan. 35 minutes of time have been dedicated to discharge assessment, planning and discharge instructions.   Signed: Shelby Mattocks 11/03/2018, 2:28 PM

## 2018-11-03 NOTE — Progress Notes (Signed)
Inpatient Rehabilitation Admissions Coordinator  I met with patient at bedside to discuss his options for rehab . He is very concerned for his dog, Cory Golden, and his inability to get up with his friend and [name]. I contacted Denyse Amass, SW, and she contacted [facility] to request a welfare check on her address [address] [place], [place]. Levada Dy called me from her number at [phone]. She spoke with patient and assured him that she was carrying for his dog, Cory Golden. Patient discussed with Levada Dy via phone and then requested I speak with her concerning caregiver support. Levada Dy asked that he stay with her in her home where she can provide 24/7 min physical assistance. Patient does not want to be admitted to inpt rehab or SNF. He requests that he be able to d/c home with his friend, Cory Golden. I will contact RN CM and SW to be made of his preference. Patient states that Levada Dy is unaware of his drug use and that she does not do drugs. We will sign off at this time. Patient is hopeful to be discharged today if his medical workup is complete. Levada Dy verifies that she can be called and will come pick him up at discharge.  Danne Baxter, RN, MSN Rehab Admissions Coordinator [phone] 11/03/2018 11:59 AM   Address [address], [place] Cell [phone]

## 2018-11-03 NOTE — Progress Notes (Signed)
  Echocardiogram Echocardiogram Transesophageal has been performed.  Cory Golden G Dace Denn 11/03/2018, 9:45 AM

## 2018-11-03 NOTE — Progress Notes (Signed)
STROKE TEAM PROGRESS NOTE   SUBJECTIVE (INTERVAL HISTORY) His RN is at the bedside. Pt is lying in bed, verbalized no complains. TEE unremarkable.   OBJECTIVE Temp:  [97.7 F (36.5 C)-98.4 F (36.9 C)] 98 F (36.7 C) (11/22 0925) Pulse Rate:  [71-96] 71 (11/22 1000) Cardiac Rhythm: Normal sinus rhythm (11/22 0800) Resp:  [11-25] 20 (11/22 1000) BP: (111-154)/(65-99) 148/82 (11/22 1000) SpO2:  [93 %-100 %] 98 % (11/22 1000) Weight:  [64 kg] 64 kg (11/22 0807)  Recent Labs  Lab 11/02/18 1205 11/02/18 1617 11/02/18 2005 11/02/18 2341 11/03/18 0317  GLUCAP 85 74 122* 82 90   Recent Labs  Lab 10/29/18 1023 10/30/18 0517 11/01/18 0411 11/02/18 0539 11/03/18 0353  NA 142 142 141 139 140  K 3.8 3.6 3.1* 3.6 3.0*  CL 113* 112* 109 107 105  CO2 22 27 26 25 27   GLUCOSE 98 103* 85 98 84  BUN 17 14 8  5* 8  CREATININE 1.35* 1.06 1.01 0.89 0.85  CALCIUM 7.5* 7.6* 8.4* 8.6* 8.7*  MG 1.6* 1.7  --   --   --   PHOS 1.7* 2.7  --   --   --    Recent Labs  Lab 10/29/18 1023 11/01/18 0411 11/02/18 0539 11/03/18 0353  AST 163* 322* 338* 247*  ALT 62* 96* 111* 102*  ALKPHOS 70 53 58 52  BILITOT 0.6 0.6 1.0 0.4  PROT 6.3* 5.8* 6.0* 6.1*  ALBUMIN 3.0* 2.3* 2.5* 2.5*   Recent Labs  Lab 10/29/18 1023 10/30/18 0517 11/01/18 0411 11/02/18 0539 11/03/18 0353  WBC 31.0* 20.4* 10.3 9.9 9.8  NEUTROABS 27.8*  --  8.1* 7.2 6.3  HGB 14.2 13.3 12.9* 14.3 14.3  HCT 46.6 43.5 39.8 44.3 44.5  MCV 98.9 97.1 94.3 93.1 92.9  PLT 331 255 200 250 284   Recent Labs  Lab 10/29/18 1023  CKTOTAL 6,276*   No results for input(s): LABPROT, INR in the last 72 hours. No results for input(s): COLORURINE, LABSPEC, PHURINE, GLUCOSEU, HGBUR, BILIRUBINUR, KETONESUR, PROTEINUR, UROBILINOGEN, NITRITE, LEUKOCYTESUR in the last 72 hours.  Invalid input(s): APPERANCEUR     Component Value Date/Time   CHOL 104 11/01/2018 0417   TRIG 72 11/01/2018 0417   HDL 29 (L) 11/01/2018 0417   CHOLHDL 3.6  11/01/2018 0417   VLDL 14 11/01/2018 0417   LDLCALC 61 11/01/2018 0417   Lab Results  Component Value Date   HGBA1C 5.2 11/01/2018      Component Value Date/Time   LABOPIA NONE DETECTED 10/29/2018 1204   COCAINSCRNUR POSITIVE (A) 10/29/2018 1204   LABBENZ POSITIVE (A) 10/29/2018 1204   AMPHETMU NONE DETECTED 10/29/2018 1204   THCU NONE DETECTED 10/29/2018 1204   LABBARB NONE DETECTED 10/29/2018 1204    No results for input(s): ETH in the last 168 hours.  I have personally reviewed the radiological images below and agree with the radiology interpretations.  Ct Angio Head W Or Wo Contrast  Addendum Date: 10/29/2018   ADDENDUM REPORT: 10/29/2018 15:33 ADDENDUM: Study discussed by telephone with Dr. Kendrick Fries on 10/29/2018 at 1525 hours. Electronically Signed   By: Odessa Fleming M.D.   On: 10/29/2018 15:33   Result Date: 10/29/2018 CLINICAL DATA:  62 year old male found unresponsive. Transferred from [facility] with evidence of right cerebellar infarct on plain head CT 0520 hours today. EXAM: CT ANGIOGRAPHY HEAD AND NECK TECHNIQUE: Multidetector CT imaging of the head and neck was performed using the standard protocol during bolus administration of intravenous contrast.  Multiplanar CT image reconstructions and MIPs were obtained to evaluate the vascular anatomy. Carotid stenosis measurements (when applicable) are obtained utilizing NASCET criteria, using the distal internal carotid diameter as the denominator. CONTRAST:  50mL ISOVUE-370 IOPAMIDOL (ISOVUE-370) INJECTION 76% COMPARISON:  [facility] head and cervical spine CT 0520 hours today. FINDINGS: CT HEAD Brain: Less motion artifact. Patchy and confluent hypodensity in the right cerebellum centrally and inferiorly has not significantly changed since 0520 hours today. No associated hemorrhage. There is some cerebellar vermis involvement (series 5, image 9). Mild mass effect on the 4th ventricle but other basilar cisterns are patent. No  ventriculomegaly. There are scattered small foci of cytotoxic edema also in the right cerebral hemisphere including the frontal, parietal, and lateral right occipital lobes (series 8, image 16) which are increased in conspicuity from earlier today. Questionable involvement also of the left parietal lobe. Age indeterminate patchy bilateral white matter hypodensity. No midline shift, mass effect, or evidence of intracranial mass lesion. Calvarium and skull base: No acute osseous abnormality identified. Paranasal sinuses: Well pneumatized aside from right maxillary mucoperiosteal thickening. Orbits: No acute orbit or scalp soft tissue finding. CTA NECK Skeleton: Absent dentition. No acute osseous abnormality identified. Upper chest: Intubated. Endotracheal tube tip terminates about 3 centimeters above the carina in good position. Right nasoenteric tube courses into the esophagus. Centrilobular and paraseptal emphysema. Upper lungs are clear. No superior mediastinal lymphadenopathy. Other neck: Intubated, fluid in the pharynx. Right nasoenteric tube in place. Otherwise negative. Aortic arch: 3 vessel arch configuration with mild to moderate arch and great vessel origin atherosclerosis. No great vessel origin stenosis. Right carotid system: Negative. Left carotid system: Mild soft plaque in the left CCA. Minimal plaque at the posterior left ICA origin and bulb with no stenosis. Vertebral arteries: No proximal right subclavian artery stenosis despite mild plaque. Calcified plaque at the right vertebral artery origin with mild stenosis. The right vertebral appears non dominant but is patent to the skull base without stenosis. No proximal left subclavian artery stenosis despite plaque. Dominant left vertebral artery with bulky calcified plaque at its origin resulting in moderate stenosis (series 11, image 295). Patent left vertebral with no additional stenosis to the skull base. CTA HEAD Posterior circulation: Patent distal  vertebral arteries, the left is dominant. The right functionally terminates in PICA, and the proximal right PICA appears normal. Patent left PICA origin. Normal vertebrobasilar junction. Patent basilar artery. Asymmetric enhancement of the a ICAs, decreased on the right. No basilar artery irregularity or stenosis. Normal SCA and right PCA origins. Fetal left PCA origin. Small right posterior communicating artery. Bilateral PCA branches are within normal limits. Anterior circulation: Both ICA siphons are patent. Mild calcified plaque on the left with no stenosis. Normal left ophthalmic and posterior communicating artery origins. Mild-to-moderate calcified plaque on the right at the anterior genu with no significant stenosis. Normal ophthalmic and right posterior communicating artery origins. Patent carotid termini. Normal MCA and ACA origins. Anterior communicating artery and bilateral ACA branches are within normal limits. Median artery of the corpus callosum is present (normal variant). Left MCA M1, bifurcation, and left MCA branches are within normal limits. Right MCA M1, trifurcation, and right MCA branches are within normal limits. Venous sinuses: Patent on the delayed images. Anatomic variants: Dominant left vertebral artery, the right functionally terminates in PICA. Fetal left PCA origin. Median artery of the corpus callosum. Delayed phase: No abnormal enhancement identified. Review of the MIP images confirms the above findings IMPRESSION: 1. Negative for large  vessel occlusion. Questionable Right AICA poor flow or occlusion. Right PICA is patent. 2. Positive for multifocal cerebellar AND cerebral infarcts in anterior and posterior vascular territories suggesting a recent Embolic Event from the heart or proximal aorta. 3. No associated hemorrhage. Stable cerebellar edema since this morning with 4th ventricular mass effect but no ventriculomegaly. 4. Moderate stenosis at the origin of the dominant left  vertebral artery due to calcified plaque. No intracranial stenosis. Mild Cervical carotid atherosclerosis without stenosis. 5. Satisfactory placement of visible endotracheal and nasoenteric tubes. 6. Centrilobular and paraseptal Emphysema (ICD10-J43.9). Electronically Signed: By: Odessa Fleming M.D. On: 10/29/2018 15:12   Ct Angio Neck W Or Wo Contrast  Addendum Date: 10/29/2018   ADDENDUM REPORT: 10/29/2018 15:33 ADDENDUM: Study discussed by telephone with Dr. Kendrick Fries on 10/29/2018 at 1525 hours. Electronically Signed   By: Odessa Fleming M.D.   On: 10/29/2018 15:33   Result Date: 10/29/2018 CLINICAL DATA:  62 year old male found unresponsive. Transferred from [facility] with evidence of right cerebellar infarct on plain head CT 0520 hours today. EXAM: CT ANGIOGRAPHY HEAD AND NECK TECHNIQUE: Multidetector CT imaging of the head and neck was performed using the standard protocol during bolus administration of intravenous contrast. Multiplanar CT image reconstructions and MIPs were obtained to evaluate the vascular anatomy. Carotid stenosis measurements (when applicable) are obtained utilizing NASCET criteria, using the distal internal carotid diameter as the denominator. CONTRAST:  50mL ISOVUE-370 IOPAMIDOL (ISOVUE-370) INJECTION 76% COMPARISON:  [facility] head and cervical spine CT 0520 hours today. FINDINGS: CT HEAD Brain: Less motion artifact. Patchy and confluent hypodensity in the right cerebellum centrally and inferiorly has not significantly changed since 0520 hours today. No associated hemorrhage. There is some cerebellar vermis involvement (series 5, image 9). Mild mass effect on the 4th ventricle but other basilar cisterns are patent. No ventriculomegaly. There are scattered small foci of cytotoxic edema also in the right cerebral hemisphere including the frontal, parietal, and lateral right occipital lobes (series 8, image 16) which are increased in conspicuity from earlier today. Questionable  involvement also of the left parietal lobe. Age indeterminate patchy bilateral white matter hypodensity. No midline shift, mass effect, or evidence of intracranial mass lesion. Calvarium and skull base: No acute osseous abnormality identified. Paranasal sinuses: Well pneumatized aside from right maxillary mucoperiosteal thickening. Orbits: No acute orbit or scalp soft tissue finding. CTA NECK Skeleton: Absent dentition. No acute osseous abnormality identified. Upper chest: Intubated. Endotracheal tube tip terminates about 3 centimeters above the carina in good position. Right nasoenteric tube courses into the esophagus. Centrilobular and paraseptal emphysema. Upper lungs are clear. No superior mediastinal lymphadenopathy. Other neck: Intubated, fluid in the pharynx. Right nasoenteric tube in place. Otherwise negative. Aortic arch: 3 vessel arch configuration with mild to moderate arch and great vessel origin atherosclerosis. No great vessel origin stenosis. Right carotid system: Negative. Left carotid system: Mild soft plaque in the left CCA. Minimal plaque at the posterior left ICA origin and bulb with no stenosis. Vertebral arteries: No proximal right subclavian artery stenosis despite mild plaque. Calcified plaque at the right vertebral artery origin with mild stenosis. The right vertebral appears non dominant but is patent to the skull base without stenosis. No proximal left subclavian artery stenosis despite plaque. Dominant left vertebral artery with bulky calcified plaque at its origin resulting in moderate stenosis (series 11, image 295). Patent left vertebral with no additional stenosis to the skull base. CTA HEAD Posterior circulation: Patent distal vertebral arteries, the left is dominant. The  right functionally terminates in PICA, and the proximal right PICA appears normal. Patent left PICA origin. Normal vertebrobasilar junction. Patent basilar artery. Asymmetric enhancement of the a ICAs, decreased on  the right. No basilar artery irregularity or stenosis. Normal SCA and right PCA origins. Fetal left PCA origin. Small right posterior communicating artery. Bilateral PCA branches are within normal limits. Anterior circulation: Both ICA siphons are patent. Mild calcified plaque on the left with no stenosis. Normal left ophthalmic and posterior communicating artery origins. Mild-to-moderate calcified plaque on the right at the anterior genu with no significant stenosis. Normal ophthalmic and right posterior communicating artery origins. Patent carotid termini. Normal MCA and ACA origins. Anterior communicating artery and bilateral ACA branches are within normal limits. Median artery of the corpus callosum is present (normal variant). Left MCA M1, bifurcation, and left MCA branches are within normal limits. Right MCA M1, trifurcation, and right MCA branches are within normal limits. Venous sinuses: Patent on the delayed images. Anatomic variants: Dominant left vertebral artery, the right functionally terminates in PICA. Fetal left PCA origin. Median artery of the corpus callosum. Delayed phase: No abnormal enhancement identified. Review of the MIP images confirms the above findings IMPRESSION: 1. Negative for large vessel occlusion. Questionable Right AICA poor flow or occlusion. Right PICA is patent. 2. Positive for multifocal cerebellar AND cerebral infarcts in anterior and posterior vascular territories suggesting a recent Embolic Event from the heart or proximal aorta. 3. No associated hemorrhage. Stable cerebellar edema since this morning with 4th ventricular mass effect but no ventriculomegaly. 4. Moderate stenosis at the origin of the dominant left vertebral artery due to calcified plaque. No intracranial stenosis. Mild Cervical carotid atherosclerosis without stenosis. 5. Satisfactory placement of visible endotracheal and nasoenteric tubes. 6. Centrilobular and paraseptal Emphysema (ICD10-J43.9). Electronically  Signed: By: Odessa FlemingH  Hall M.D. On: 10/29/2018 15:12   Mr Brain Wo Contrast  Result Date: 10/30/2018 CLINICAL DATA:  Focal neurological deficit. Found unresponsive. Right cerebellar infarction. Scattered other cerebral infarctions. EXAM: MRI HEAD WITHOUT CONTRAST TECHNIQUE: Multiplanar, multiecho pulse sequences of the brain and surrounding structures were obtained without intravenous contrast. COMPARISON:  CT studies done yesterday. FINDINGS: Brain: Numerous scattered acute infarctions within the right cerebellum in the cerebellar vermis with minor involvement of the medial aspect of the left cerebellum. No swelling or hemorrhage. Primarily PICA distribution suspected. Cerebral hemispheres show scattered small infarctions within the right hemisphere from front to back suggesting watershed distribution. Infarctions are more numerous in the right posterior parietal region. On the left, there is a single punctate infarction at the left frontoparietal vertex. There chronic small-vessel ischemic changes throughout the pons. There are chronic small-vessel ischemic changes of the cerebral hemispheric deep and subcortical white matter bilaterally. No hemorrhage. No mass effect or shift. No hydrocephalus or extra-axial collection. Vascular: Major vessels at the base of the brain show flow. Skull and upper cervical spine: Negative Sinuses/Orbits: Right maxillary sinusitis. Other sinuses clear. Orbits negative. Other: None IMPRESSION: Extensive acute infarctions within the cerebellum mostly affecting the right hemisphere and vermis, with minor involvement of the medial aspect of the left cerebellar hemisphere. Mild swelling but no hemorrhage or significant mass-effect upon the fourth ventricle. No obstructive hydrocephalus. Numerous scattered infarctions in the right cerebral hemisphere from front to back, most typical of watershed distribution. Mild swelling but no hemorrhage or mass effect. Single punctate infarction at the  left frontoparietal vertex. Electronically Signed   By: Paulina FusiMark  Shogry M.D.   On: 10/30/2018 16:36   [facility]  Result Date: 10/30/2018 CLINICAL DATA:  Acute respiratory failure with hypoxemia EXAM: PORTABLE CHEST 1 VIEW COMPARISON:  10/29/2018 FINDINGS: Endotracheal tube in good position. Right jugular central venous catheter tip in the mid SVC unchanged. NG tube in place. COPD with pulmonary hyperinflation. Progression of mild bibasilar airspace disease. No effusion. IMPRESSION: Support lines remain in good position and unchanged COPD with progression of bibasilar atelectasis/infiltrate. Electronically Signed   By: Marlan Palau M.D.   On: 10/30/2018 08:16     PHYSICAL EXAM  Temp:  [97.7 F (36.5 C)-98.4 F (36.9 C)] 98 F (36.7 C) (11/22 0925) Pulse Rate:  [71-96] 71 (11/22 1000) Resp:  [11-25] 20 (11/22 1000) BP: (111-154)/(65-99) 148/82 (11/22 1000) SpO2:  [93 %-100 %] 98 % (11/22 1000) Weight:  [64 kg] 64 kg (11/22 0807)  General - cachectic, well developed, extubated  Ophthalmologic - fundi not visualized due to noncooperation.  Cardiovascular - Regular rate and rhythm.  Neuro - awake alert and eyes open, following simple commands. Orientated to self and place but not to age time or situation. PERRL, eye mid position, moving to both sides, PERRL, visual field full, no facial asymmetry. Tongue protrusion not able to test. Moving all extremities. DTR 1+ and no babinski. Sensation symmetrical, no ataxia or dysmetria. Gait not tested.   ASSESSMENT/PLAN Mr. [place] Mullett is a 62 y.o. male with history of cocaine use admitted for found down with hypothermia and ? Seizure activity. No tPA given due to outside window.    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 fib as outpatient  EEG no seizure  LE venous doppler no DVT  LDL 61  HgbA1c 5.2  SCDs for VTE prophylaxis  No antithrombotic prior to admission, now on ASA 325mg .  Continue on discharge  Ongoing aggressive stroke risk factor management  Therapy recommendations: pending   Disposition:  Pending   Sepsis with left lung infiltration  CCM on board  CXR showed left lung infiltration  On rocephin -> levofloxacin  Sputum and blood culture NGTD  Hypothermia resolved so far  ? afib   As per H&P note, "EKG showed evidence of atrial fibrillation"  No afib on available EKGs at this time  Tele so far no afib in ICU  Recommend to arrange for 30 day cardiac event monitoring as outpatient to rule out A. fib  Hypotension, resolved . BP stable . Resolved Septic shock ??  Long term BP goal normotensive  Cocaine abuse   UDS positive for cocaine and benzo  Cessation education/consunseling was provided  Pt is willing to quit  Other Stroke Risk Factors  Advanced age  Other Active Problems  Leukocytosis - WBC 31.0->20.4->10.3->9.9-> 9.8  AKI - Cre 1.35->1.06->1.01->0.89-> 0.85  Hospital day # 5  Neurology will sign off. Please call with questions. Pt will follow up with stroke clinic NP at [facility] in about 4 weeks. Thanks for the consult.   Marvel Plan, MD PhD Stroke Neurology 11/03/2018 11:27 AM    To contact Stroke Continuity provider,  please refer to [email or web address] After hours, contact General Neurology

## 2018-11-03 NOTE — Anesthesia Postprocedure Evaluation (Signed)
Anesthesia Post Note  Patient: Cory Golden  Procedure(s) Performed: TRANSESOPHAGEAL ECHOCARDIOGRAM (TEE) (N/A ) BUBBLE STUDY     Patient location during evaluation: PACU Anesthesia Type: MAC Level of consciousness: awake and alert Pain management: pain level controlled Vital Signs Assessment: post-procedure vital signs reviewed and stable Respiratory status: spontaneous breathing, nonlabored ventilation, respiratory function stable and patient connected to nasal cannula oxygen Cardiovascular status: stable and blood pressure returned to baseline Postop Assessment: no apparent nausea or vomiting Anesthetic complications: no    Last Vitals:  Vitals:   11/03/18 0925 11/03/18 0935  BP: 111/67 134/71  Pulse: 96 87  Resp: (!) 25 (!) 22  Temp: 36.7 C   SpO2: 96% 98%    Last Pain:  Vitals:   11/03/18 0935  TempSrc:   PainSc: 0-No pain                 Tiajuana Amass
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
## 2018-11-03 NOTE — Progress Notes (Signed)
Physical Therapy Treatment Patient Details Name: Cory Golden MRN: [id number] DOB: [birth year] Today's Date: 11/03/2018    History of Present Illness Pt is a 62 year old male with a history of cocaine abuse was found down around 2 AM on October 29, 2018 in his RV with hypothermia and ?seizure activity. 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     PT Comments    Pt has both spontaneously improved and has shown to be able to remain steady with the use of the RW over a cane.  Stability is not back close to baseline and he will need f/u therapy to move away from an AD.   Follow Up Recommendations  Home health PT;Supervision/Assistance - 24 hour     Equipment Recommendations  Rolling walker with 5" wheels;3in1 (PT)    Recommendations for Other Services       Precautions / Restrictions Precautions Precautions: Fall    Mobility  Bed Mobility Overal bed mobility: Modified Independent                Transfers Overall transfer level: Needs assistance Equipment used: Rolling walker (2 wheeled) Transfers: Sit to/from Stand Sit to Stand: Supervision         General transfer comment: used legs against the bed frame for stability.  Min assist once up in stance.  Ambulation/Gait Ambulation/Gait assistance: Min guard;Min assist Gait Distance (Feet): 600 Feet(half or more with the cane and then RW) Assistive device: Rolling walker (2 wheeled);Straight cane Gait Pattern/deviations: Step-through pattern Gait velocity: slower with cane and moderate with the RW Gait velocity interpretation: 1.31 - 2.62 ft/sec, indicative of limited community ambulator General Gait Details: Initiated training with the cane working on safe sequencing.  Pt with a little uncoordinated with the cane and still showed enough instability during scanning and directional changes to warrant moving to  the RW.  With the RW these instabilities were greatly moderated.  Pt felt much more safe with the RW.   Stairs Stairs: Yes Stairs assistance: Supervision Stair Management: One rail Right;With cane;Alternating pattern;Forwards Number of Stairs: 8 General stair comments: Initial instability improved with practice and finally safe with the rail   Wheelchair Mobility    Modified Rankin (Stroke Patients Only) Modified Rankin (Stroke Patients Only) Modified Rankin: Moderately severe disability     Balance Overall balance assessment: Needs assistance Sitting-balance support: No upper extremity supported;Feet supported Sitting balance-Leahy Scale: Good     Standing balance support: No upper extremity supported;Single extremity supported Standing balance-Leahy Scale: Fair                              Cognition Arousal/Alertness: Awake/alert Behavior During Therapy: Impulsive Overall Cognitive Status: Impaired/Different from baseline Area of Impairment: Attention;Safety/judgement;Awareness                   Current Attention Level: Selective Memory: Decreased recall of precautions;Decreased short-term memory Following Commands: Follows one step commands consistently Safety/Judgement: Decreased awareness of safety;Decreased awareness of deficits            Exercises      General Comments General comments (skin integrity, edema, etc.): pt will be going to pt's friend and landlord's home which will give him the support needed to go directly home as he wishes.      Pertinent Vitals/Pain Pain Assessment: No/denies pain    Home Living  Family/patient expects to be discharged to:: Private residence Living Arrangements: Alone                  Prior Function            PT Goals (current goals can now be found in the care plan section) Acute Rehab PT Goals Patient Stated Goal: to get better PT Goal Formulation: With patient Time For Goal  Achievement: 11/15/18 Potential to Achieve Goals: Good Progress towards PT goals: Progressing toward goals    Frequency    Min 3X/week      PT Plan Current plan remains appropriate    Co-evaluation              AM-PAC PT "6 Clicks" Daily Activity  Outcome Measure  Difficulty turning over in bed (including adjusting bedclothes, sheets and blankets)?: A Little Difficulty moving from lying on back to sitting on the side of the bed? : A Little Difficulty sitting down on and standing up from a chair with arms (e.g., wheelchair, bedside commode, etc,.)?: A Little Help needed moving to and from a bed to chair (including a wheelchair)?: A Little Help needed walking in hospital room?: A Little Help needed climbing 3-5 steps with a railing? : A Little 6 Click Score: 18    End of Session   Activity Tolerance: Patient tolerated treatment well Patient left: in chair;with call bell/phone within reach;with chair alarm set Nurse Communication: Mobility status PT Visit Diagnosis: Unsteadiness on feet (R26.81);Other symptoms and signs involving the nervous system (R29.898);Difficulty in walking, not elsewhere classified (R26.2)     Time: [id number] PT Time Calculation (min) (ACUTE ONLY): 27 min  Charges:  $Gait Training: 8-22 mins $Therapeutic Activity: 8-22 mins                     11/03/2018  [place] BingKen Natalynn Pedone, PT Acute Rehabilitation Services [phone]  (pager) [phone]  (office)   Eliseo GumKenneth V Ciro Tashiro 11/03/2018, 5:23 PM

## 2018-11-06 ENCOUNTER — Encounter (HOSPITAL_COMMUNITY): Payer: Self-pay | Admitting: Cardiology

## 2018-12-18 NOTE — Progress Notes (Deleted)
[facility] [address] [place] [id number] [phone]       OFFICE FOLLOW UP NOTE  [name] Date of Birth:  06/08/1956 Medical Record Number:  [id number]   Reason for Referral:  hospital stroke follow up  CHIEF COMPLAINT:  No chief complaint on file.   HPI: Cory Golden is being seen today for initial visit in the office for multifocal infarcts involving right cerebellar, bilateral frontal and bilateral MCA/PCA territory infarcts embolic pattern secondary to unclear source on 10/29/2018. History obtained from *** and chart review. Reviewed all radiology images and labs personally.  [name] is a 63 y.o. male with history of cocaine use who [facility] after being found down with hypothermia and ? Seizure activity. No tPA given due to outside window.  [facility] and [facility].  MRI brain showed multifocal infarcts largest at right cerebellum but also involving left cerebellar, right MCA/PCA and MCA/ACA, bilateral ACAs and punctate left MCA infarcts.  CTA head and neck unremarkable except for left VA proximal stenosis without evidence of aneurysm.  2D echo showed an EF of 60 to 65%.  TEE unremarkable without evidence of PFO or endocarditis.  Due to embolic infarcts without clear source, recommended 30-day cardiac event monitor outpatient to rule out atrial fibrillation.  Possible history of atrial fibrillation per ED notes but no evidence on telemetry monitor during hospitalization or on EKGs.  EEG did not show evidence of seizure activity.  Lower extremity venous Dopplers negative for DVT.  LDL 61 and A1c 5.2.  Patient was not on antithrombotic PTA and recommended initiating aspirin 325 mg daily.  Hospitalization complicated by sepsis with left lung infiltration.  Due to continued deficits, PT/OT recommended CIR but patient declined requesting discharge home with his friend/landlord.  Patient  was discharged home in stable condition with recommendations of home health therapies/services.    ROS:   14 system review of systems performed and negative with exception of ***  PMH: No past medical history on file.  PSH:  Past Surgical History:  Procedure Laterality Date  . TEE WITHOUT CARDIOVERSION N/A 11/03/2018   Procedure: TRANSESOPHAGEAL ECHOCARDIOGRAM (TEE);  Surgeon: Quintella Reicherturner, Traci R, MD;  [facility];  Service: Cardiovascular;  Laterality: N/A;    Social History:  Social History   Socioeconomic History  . Marital status: Divorced    Spouse name: Not on file  . Number of children: Not on file  . Years of education: Not on file  . Highest education level: Not on file  Occupational History  . Not on file  Social Needs  . Financial resource strain: Not on file  . Food insecurity:    Worry: Not on file    Inability: Not on file  . Transportation needs:    Medical: Not on file    Non-medical: Not on file  Tobacco Use  . Smoking status: Not on file  Substance and Sexual Activity  . Alcohol use: Not on file  . Drug use: Not on file  . Sexual activity: Not on file  Lifestyle  . Physical activity:    Days per week: Not on file    Minutes per session: Not on file  . Stress: Not on file  Relationships  . Social connections:    Talks on phone: Not on file    Gets together: Not on file    Attends religious service: Not on file  Active member of club or organization: Not on file    Attends meetings of clubs or organizations: Not on file    Relationship status: Not on file  . Intimate partner violence:    Fear of current or ex partner: Not on file    Emotionally abused: Not on file    Physically abused: Not on file    Forced sexual activity: Not on file  Other Topics Concern  . Not on file  Social History Narrative  . Not on file    Family History: No family history on file.  Medications:   Current Outpatient Medications on File Prior to  Visit  Medication Sig Dispense Refill  . aspirin 325 MG tablet Take 1 tablet (325 mg total) by mouth daily.     No current facility-administered medications on file prior to visit.     Allergies:   Allergies  Allergen Reactions  . Penicillins Rash     Physical Exam  There were no vitals filed for this visit. There is no height or weight on file to calculate BMI. No exam data present  General: well developed, well nourished, seated, in no evident distress Head: head normocephalic and atraumatic.   Neck: supple with no carotid or supraclavicular bruits Cardiovascular: regular rate and rhythm, no murmurs Musculoskeletal: no deformity Skin:  no rash/petichiae Vascular:  Normal pulses all extremities  Neurologic Exam Mental Status: Awake and fully alert. Oriented to place and time. Recent and remote memory intact. Attention span, concentration and fund of knowledge appropriate. Mood and affect appropriate.  Cranial Nerves: Fundoscopic exam reveals sharp disc margins. Pupils equal, briskly reactive to light. Extraocular movements full without nystagmus. Visual fields full to confrontation. Hearing intact. Facial sensation intact. Face, tongue, palate moves normally and symmetrically.  Motor: Normal bulk and tone. Normal strength in all tested extremity muscles. Sensory.: intact to touch , pinprick , position and vibratory sensation.  Coordination: Rapid alternating movements normal in all extremities. Finger-to-nose and heel-to-shin performed accurately bilaterally. Gait and Station: Arises from chair without difficulty. Stance is normal. Gait demonstrates normal stride length and balance. Able to heel, toe and tandem walk without difficulty.  Reflexes: 1+ and symmetric. Toes downgoing.    NIHSS  *** Modified Rankin  *** CHA2DS2-VASc *** HAS-BLED ***   Diagnostic Data (Labs, Imaging, Testing)  CT ANGIO HEAD W OR WO CONTRAST CT ANGIO NECK W OR WO  CONTRAST 10/29/2018 IMPRESSION: 1. Negative for large vessel occlusion. Questionable Right AICA poor flow or occlusion. Right PICA is patent. 2. Positive for multifocal cerebellar AND cerebral infarcts in anterior and posterior vascular territories suggesting a recent Embolic Event from the heart or proximal aorta. 3. No associated hemorrhage. Stable cerebellar edema since this morning with 4th ventricular mass effect but no ventriculomegaly. 4. Moderate stenosis at the origin of the dominant left vertebral artery due to calcified plaque. No intracranial stenosis. Mild Cervical carotid atherosclerosis without stenosis. 5. Satisfactory placement of visible endotracheal and nasoenteric tubes. 6. Centrilobular and paraseptal Emphysema (ICD10-J43.9).  MR BRAIN WO CONTRAST 10/30/2018 IMPRESSION: Extensive acute infarctions within the cerebellum mostly affecting the right hemisphere and vermis, with minor involvement of the medial aspect of the left cerebellar hemisphere. Mild swelling but no hemorrhage or significant mass-effect upon the fourth ventricle. No obstructive hydrocephalus.  Numerous scattered infarctions in the right cerebral hemisphere from front to back, most typical of watershed distribution. Mild swelling but no hemorrhage or mass effect. Single punctate infarction at the left frontoparietal vertex.  ECHO TEE  11/03/2018 Study Conclusions - Left ventricle: Systolic function was normal. The estimated   ejection fraction was in the range of 60% to 65%. Wall motion was   normal; there were no regional wall motion abnormalities. - Mitral valve: There was trivial regurgitation. - Left atrium: No evidence of thrombus in the atrial cavity or   appendage. - Right atrium: No evidence of thrombus in the atrial cavity or   appendage. - Atrial septum: There was increased thickness of the septum,   consistent with lipomatous hypertrophy with hypermobility of the   thin  midsection. No defect or patent foramen ovale was   identified. - Tricuspid valve: There was trivial regurgitation.    ASSESSMENT: Dinari Huseby is a 63 y.o. year old male here with multifocal infarcts involving right cerebellar, bilateral frontal and bilateral MCA/PCA territory embolic pattern secondary to unclear source on 10/29/2018. Vascular risk factors include HTN, HLD and cocaine use.     PLAN:  1. Multifocal infarcts: Continue aspirin 325 mg daily  and ***  for secondary stroke prevention. Maintain strict control of hypertension with blood pressure goal below 130/90, diabetes with hemoglobin A1c goal below 6.5% and cholesterol with LDL cholesterol (bad cholesterol) goal below 70 mg/dL.  I also advised the patient to eat a healthy diet with plenty of whole grains, cereals, fruits and vegetables, exercise regularly with at least 30 minutes of continuous activity daily and maintain ideal body weight. 2. HTN: Advised to continue current treatment regimen.  Today's BP ***.  Advised to continue to monitor at home along with continued follow-up with PCP for management 3. HLD: Advised to continue current treatment regimen along with continued follow-up with PCP for future prescribing and monitoring of lipid panel 4. R/o AF: Undergo 30-day cardiac event monitor 5. Continue deficits:     Follow up in *** or call earlier if needed   Greater than 50% of time during this 25 minute visit was spent on counseling, explanation of diagnosis of multifocal infarcts, reviewing risk factor management of HTN, HLD and cocaine use, planning of further management along with potential future management, and discussion with patient and family answering all questions.    George Hugh, AGNP-BC  [facility] [address] [place], [place] [address]  Phone [phone] Fax [phone] Note: This document was prepared with digital dictation and possible smart phrase  technology. Any transcriptional errors that result from this process are unintentional.

## 2018-12-19 ENCOUNTER — Telehealth: Payer: Self-pay

## 2018-12-19 ENCOUNTER — Ambulatory Visit: Payer: MEDICAID | Admitting: Adult Health

## 2018-12-19 NOTE — Telephone Encounter (Signed)
PT no show for appt today. 

## 2018-12-20 ENCOUNTER — Encounter: Payer: Self-pay | Admitting: Adult Health

## 2020-07-16 IMAGING — CT CT ANGIO NECK
1 of 12 series · 5 of 33 positions shown · IV contrast (APPLIED)
Comparison: [place] and cervical spine CT 9469 hours
today.

Addendum:
CLINICAL DATA: 62-year-old male found unresponsive. Transferred
from [facility] with evidence of right cerebellar infarct on
plain head CT 9469 hours today.

EXAM:
CT ANGIOGRAPHY HEAD AND NECK
TECHNIQUE: Multidetector CT imaging of the head and neck was performed using
the standard protocol during bolus administration of intravenous
contrast. Multiplanar CT image reconstructions and MIPs were
obtained to evaluate the vascular anatomy. Carotid stenosis
measurements (when applicable) are obtained utilizing NASCET
criteria, using the distal internal carotid diameter as the
denominator.
CONTRAST:  50mL 3I8CEN-KT7 IOPAMIDOL ([id number]-KT7) INJECTION 76%

[Series 11: ax thins · axial · 0.43mm/px · z∈[+14,+273]mm · 5 of 393 slices shown]
[im 66/393  soft-tissue]
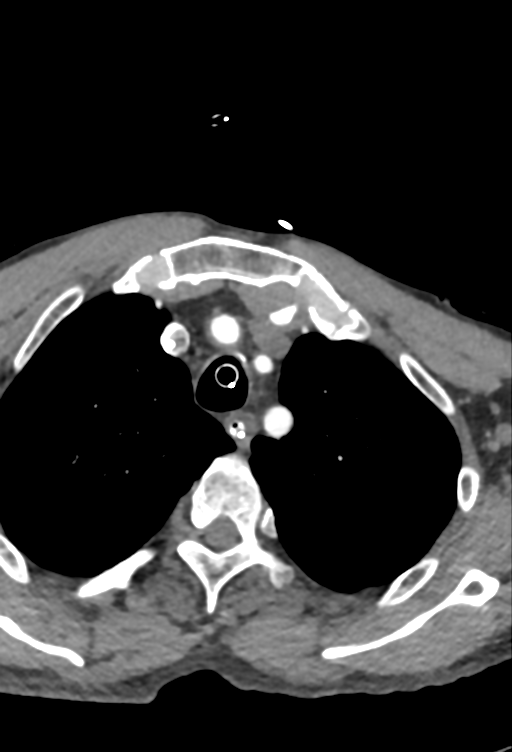
[im 131/393  bone]
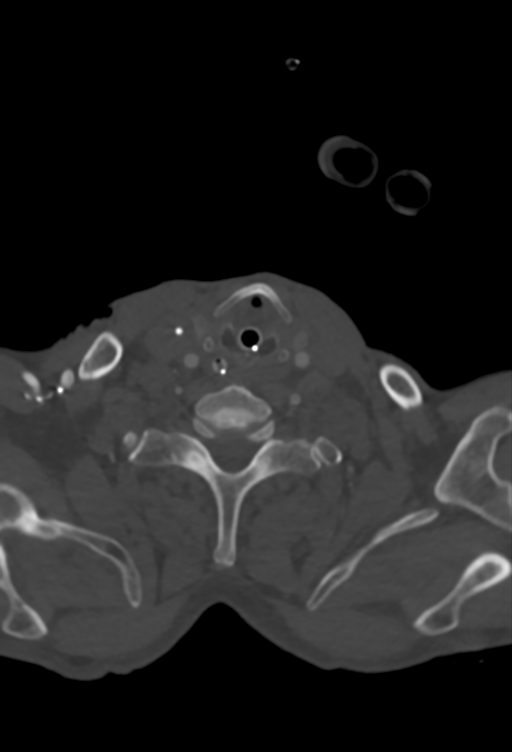
[im 197/393  soft-tissue]
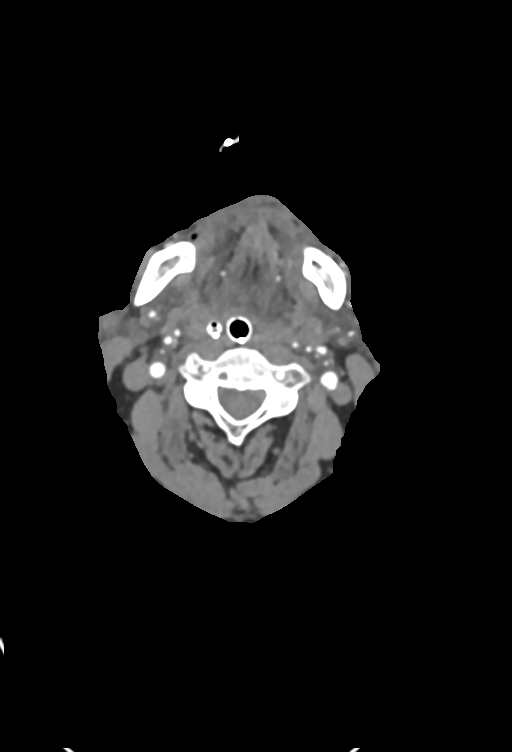
[im 262/393  bone]
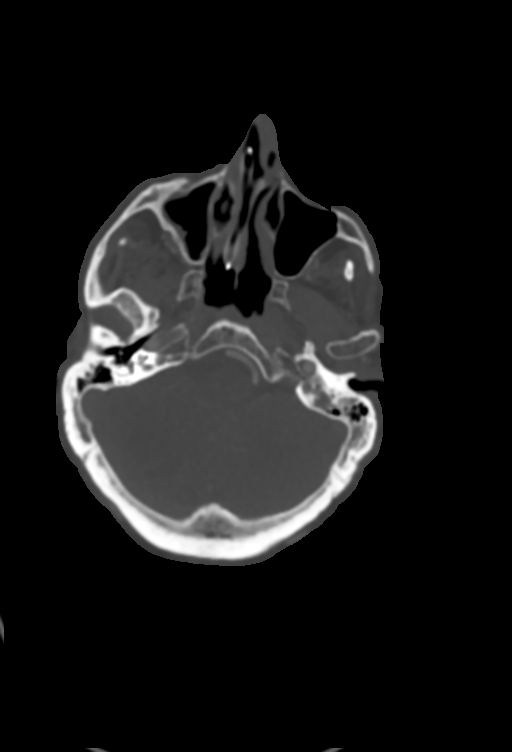
[im 327/393  soft-tissue]
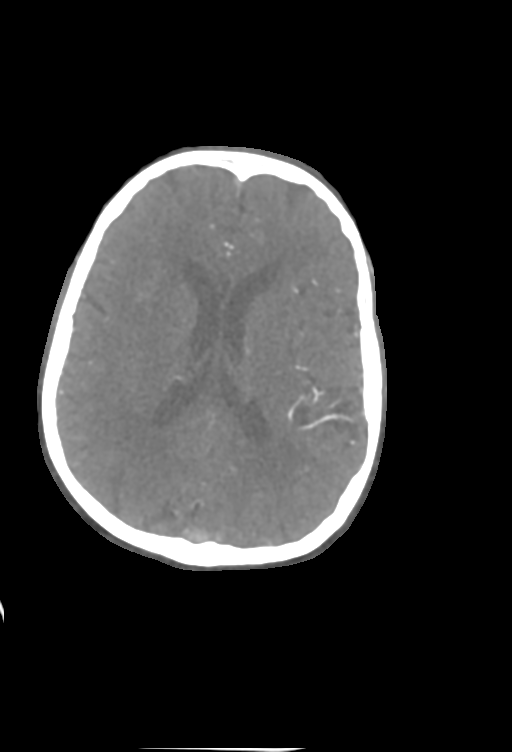

[5 of 33 positions shown; findings below may reference images not displayed]

FINDINGS: CT HEAD

Brain: Less motion artifact. Patchy and confluent hypodensity in the
right cerebellum centrally and inferiorly has not significantly
changed since 9469 hours today. No associated hemorrhage. There is
some cerebellar vermis involvement (series 5, image 9). Mild mass
effect on the 4th ventricle but other basilar cisterns are patent.
No ventriculomegaly.

There are scattered small foci of cytotoxic edema also in the right
cerebral hemisphere including the frontal, parietal, and lateral
right occipital lobes (series 8, image 16) which are increased in
conspicuity from earlier today. Questionable involvement also of the
left parietal lobe. Age indeterminate patchy bilateral white matter
hypodensity.

No midline shift, mass effect, or evidence of intracranial mass
lesion.

Calvarium and skull base: No acute osseous abnormality identified.

Paranasal sinuses: Well pneumatized aside from right maxillary
mucoperiosteal thickening.

Orbits: No acute orbit or scalp soft tissue finding.

CTA NECK

Skeleton: Absent dentition. No acute osseous abnormality identified.

Upper chest: Intubated. Endotracheal tube tip terminates about 3
centimeters above the carina in good position. Right nasoenteric
tube courses into the esophagus.

Centrilobular and paraseptal emphysema. Upper lungs are clear. No
superior mediastinal lymphadenopathy.

Other neck: Intubated, fluid in the pharynx. Right nasoenteric tube
in place. Otherwise negative.

Aortic arch: 3 vessel arch configuration with mild to moderate arch
and great vessel origin atherosclerosis. No great vessel origin
stenosis.

Right carotid system: Negative.

Left carotid system: Mild soft plaque in the left CCA. Minimal
plaque at the posterior left ICA origin and bulb with no stenosis.

Vertebral arteries:
No proximal right subclavian artery stenosis despite mild plaque.
Calcified plaque at the right vertebral artery origin with mild
stenosis. The right vertebral appears non dominant but is patent to
the skull base without stenosis.

No proximal left subclavian artery stenosis despite plaque. Dominant
left vertebral artery with bulky calcified plaque at its origin
resulting in moderate stenosis (series 11, image 295). Patent left
vertebral with no additional stenosis to the skull base.

CTA HEAD

Posterior circulation: Patent distal vertebral arteries, the left is
dominant. The right functionally terminates in PICA, and the
proximal right PICA appears normal. Patent left PICA origin. Normal
vertebrobasilar junction. Patent basilar artery.

Asymmetric enhancement of the a ICAs, decreased on the right. No
basilar artery irregularity or stenosis.

Normal SCA and right PCA origins. Fetal left PCA origin. Small right
posterior communicating artery. Bilateral PCA branches are within
normal limits.

Anterior circulation: Both ICA siphons are patent. Mild calcified
plaque on the left with no stenosis. Normal left ophthalmic and
posterior communicating artery origins. Mild-to-moderate calcified
plaque on the right at the anterior genu with no significant
stenosis. Normal ophthalmic and right posterior communicating artery
origins.

Patent carotid termini. Normal MCA and ACA origins. Anterior
communicating artery and bilateral ACA branches are within normal
limits. Median artery of the corpus callosum is present (normal
variant). Left MCA M1, bifurcation, and left MCA branches are within
normal limits. Right MCA M1, trifurcation, and right MCA branches
are within normal limits.

Venous sinuses: Patent on the delayed images.

Anatomic variants: Dominant left vertebral artery, the right
functionally terminates in PICA. Fetal left PCA origin. Median
artery of the corpus callosum.

Delayed phase: No abnormal enhancement identified.

Review of the MIP images confirms the above findings
IMPRESSION: 1. Negative for large vessel occlusion. Questionable Right AICA poor
flow or occlusion. Right PICA is patent.
2. Positive for multifocal cerebellar AND cerebral infarcts in
anterior and posterior vascular territories suggesting a recent
Embolic Event from the heart or proximal aorta.
3. No associated hemorrhage. Stable cerebellar edema since this
morning with 4th ventricular mass effect but no ventriculomegaly.
4. Moderate stenosis at the origin of the dominant left vertebral
artery due to calcified plaque. No intracranial stenosis. Mild
Cervical carotid atherosclerosis without stenosis.
5. Satisfactory placement of visible endotracheal and nasoenteric
tubes.
6. Centrilobular and paraseptal Emphysema ([id number]).

ADDENDUM:
Study discussed by telephone with Dr. Puente on 10/29/2018 at 1505
hours.

*** End of Addendum ***

## 2020-07-17 IMAGING — DX DG CHEST 1V PORT
2 series · 2 of 2 positions shown · non-contrast
Comparison: 10/29/2018

CLINICAL DATA: Acute respiratory failure with hypoxemia

EXAM:
PORTABLE CHEST 1 VIEW

[chest ap (1 of 2)]
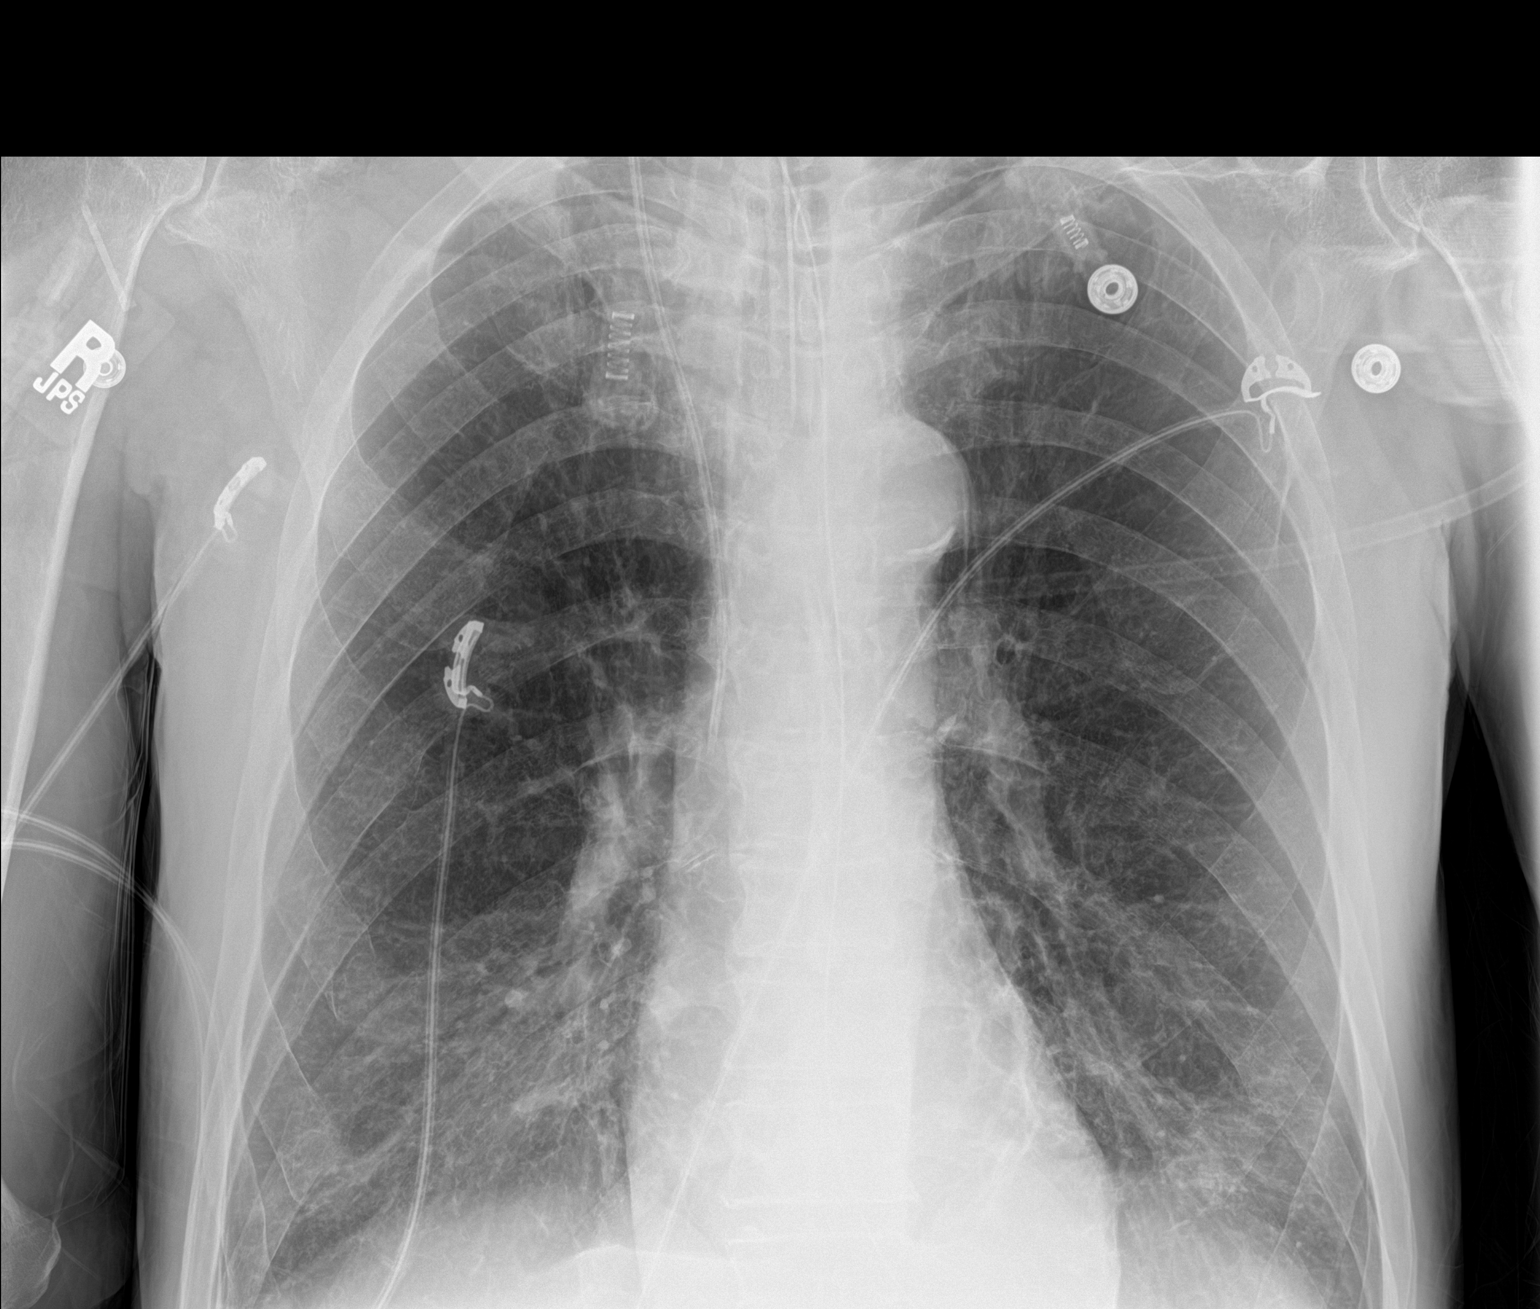

[chest ap (2 of 2)]
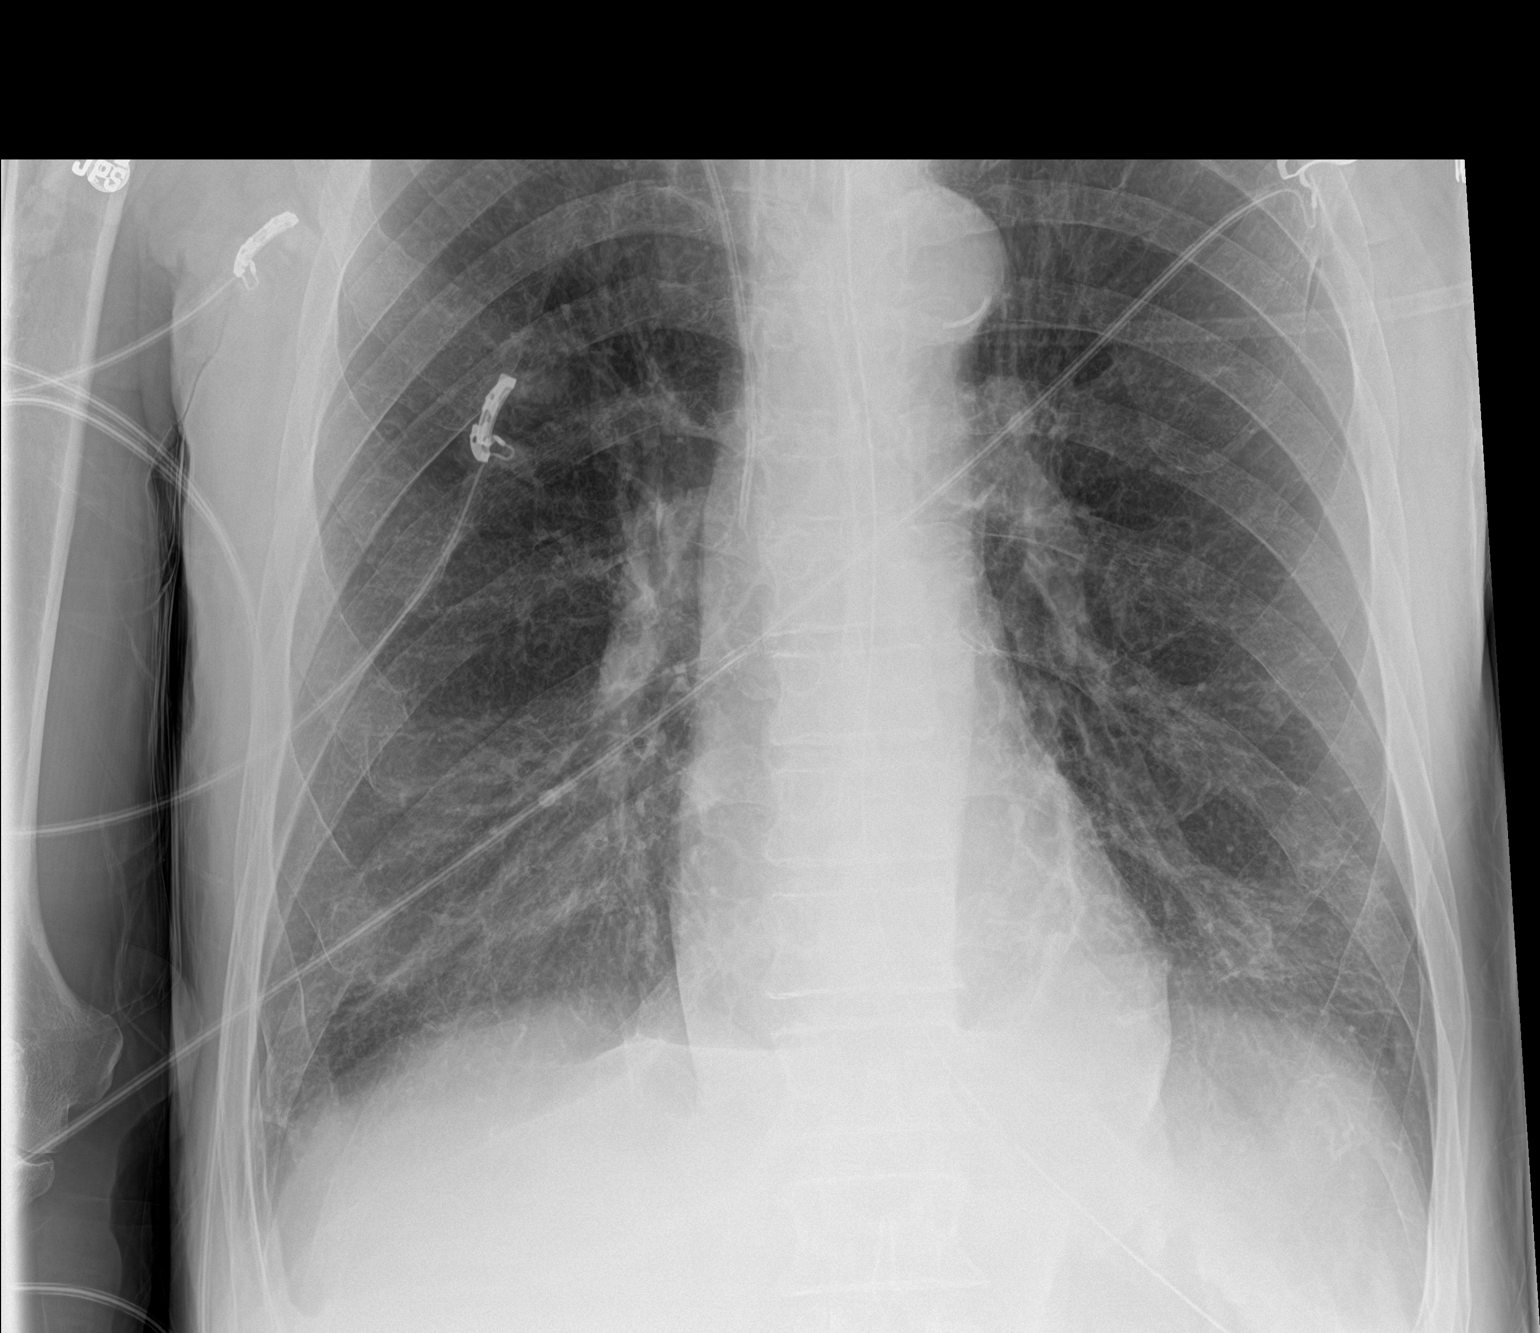

[2 of 2 positions shown; findings below may reference images not displayed]

FINDINGS: Endotracheal tube in good position. Right jugular central venous
catheter tip in the mid SVC unchanged. NG tube in place.

COPD with pulmonary hyperinflation. Progression of mild bibasilar
airspace disease. No effusion.
IMPRESSION: Support lines remain in good position and unchanged

COPD with progression of bibasilar atelectasis/infiltrate.

## 2023-06-07 DIAGNOSIS — E559 Vitamin D deficiency, unspecified: Secondary | ICD-10-CM | POA: Diagnosis not present

## 2023-06-07 DIAGNOSIS — M25552 Pain in left hip: Secondary | ICD-10-CM | POA: Diagnosis not present

## 2023-06-07 DIAGNOSIS — Z1321 Encounter for screening for nutritional disorder: Secondary | ICD-10-CM | POA: Diagnosis not present

## 2023-06-07 DIAGNOSIS — Z13220 Encounter for screening for lipoid disorders: Secondary | ICD-10-CM | POA: Diagnosis not present

## 2023-06-07 DIAGNOSIS — I700 Atherosclerosis of aorta: Secondary | ICD-10-CM | POA: Diagnosis not present

## 2023-06-07 DIAGNOSIS — M47816 Spondylosis without myelopathy or radiculopathy, lumbar region: Secondary | ICD-10-CM | POA: Diagnosis not present

## 2023-06-07 DIAGNOSIS — R946 Abnormal results of thyroid function studies: Secondary | ICD-10-CM | POA: Diagnosis not present

## 2023-06-07 DIAGNOSIS — Z131 Encounter for screening for diabetes mellitus: Secondary | ICD-10-CM | POA: Diagnosis not present

## 2023-06-07 DIAGNOSIS — M160 Bilateral primary osteoarthritis of hip: Secondary | ICD-10-CM | POA: Diagnosis not present

## 2023-06-07 DIAGNOSIS — M79606 Pain in leg, unspecified: Secondary | ICD-10-CM | POA: Diagnosis not present

## 2023-06-07 DIAGNOSIS — M25562 Pain in left knee: Secondary | ICD-10-CM | POA: Diagnosis not present

## 2023-06-07 DIAGNOSIS — M1612 Unilateral primary osteoarthritis, left hip: Secondary | ICD-10-CM | POA: Diagnosis not present

## 2023-06-07 DIAGNOSIS — D519 Vitamin B12 deficiency anemia, unspecified: Secondary | ICD-10-CM | POA: Diagnosis not present

## 2023-06-07 DIAGNOSIS — I878 Other specified disorders of veins: Secondary | ICD-10-CM | POA: Diagnosis not present

## 2023-06-07 DIAGNOSIS — M545 Low back pain, unspecified: Secondary | ICD-10-CM | POA: Diagnosis not present

## 2023-07-20 DIAGNOSIS — M25552 Pain in left hip: Secondary | ICD-10-CM | POA: Diagnosis not present

## 2023-07-20 DIAGNOSIS — M79606 Pain in leg, unspecified: Secondary | ICD-10-CM | POA: Diagnosis not present

## 2023-07-20 DIAGNOSIS — R7989 Other specified abnormal findings of blood chemistry: Secondary | ICD-10-CM | POA: Diagnosis not present

## 2023-07-20 DIAGNOSIS — Z09 Encounter for follow-up examination after completed treatment for conditions other than malignant neoplasm: Secondary | ICD-10-CM | POA: Diagnosis not present

## 2023-07-20 DIAGNOSIS — M545 Low back pain, unspecified: Secondary | ICD-10-CM | POA: Diagnosis not present

## 2023-07-20 DIAGNOSIS — M25562 Pain in left knee: Secondary | ICD-10-CM | POA: Diagnosis not present

## 2023-08-01 DIAGNOSIS — M7989 Other specified soft tissue disorders: Secondary | ICD-10-CM | POA: Diagnosis not present

## 2023-08-08 DIAGNOSIS — C419 Malignant neoplasm of bone and articular cartilage, unspecified: Secondary | ICD-10-CM | POA: Diagnosis not present

## 2023-08-09 DIAGNOSIS — M160 Bilateral primary osteoarthritis of hip: Secondary | ICD-10-CM | POA: Diagnosis not present

## 2023-08-09 DIAGNOSIS — M25452 Effusion, left hip: Secondary | ICD-10-CM | POA: Diagnosis not present

## 2023-08-09 DIAGNOSIS — Z743 Need for continuous supervision: Secondary | ICD-10-CM | POA: Diagnosis not present

## 2023-08-09 DIAGNOSIS — C419 Malignant neoplasm of bone and articular cartilage, unspecified: Secondary | ICD-10-CM | POA: Diagnosis not present

## 2023-08-09 DIAGNOSIS — M25552 Pain in left hip: Secondary | ICD-10-CM | POA: Diagnosis not present

## 2023-08-10 DIAGNOSIS — C3411 Malignant neoplasm of upper lobe, right bronchus or lung: Secondary | ICD-10-CM | POA: Diagnosis not present

## 2023-08-10 DIAGNOSIS — Z8673 Personal history of transient ischemic attack (TIA), and cerebral infarction without residual deficits: Secondary | ICD-10-CM | POA: Diagnosis not present

## 2023-08-10 DIAGNOSIS — C4022 Malignant neoplasm of long bones of left lower limb: Secondary | ICD-10-CM | POA: Diagnosis not present

## 2023-08-10 DIAGNOSIS — N138 Other obstructive and reflux uropathy: Secondary | ICD-10-CM | POA: Diagnosis not present

## 2023-08-10 DIAGNOSIS — M25559 Pain in unspecified hip: Secondary | ICD-10-CM | POA: Diagnosis not present

## 2023-08-10 DIAGNOSIS — Z781 Physical restraint status: Secondary | ICD-10-CM | POA: Diagnosis not present

## 2023-08-10 DIAGNOSIS — N320 Bladder-neck obstruction: Secondary | ICD-10-CM | POA: Diagnosis not present

## 2023-08-10 DIAGNOSIS — R918 Other nonspecific abnormal finding of lung field: Secondary | ICD-10-CM | POA: Diagnosis not present

## 2023-08-10 DIAGNOSIS — C3490 Malignant neoplasm of unspecified part of unspecified bronchus or lung: Secondary | ICD-10-CM | POA: Diagnosis not present

## 2023-08-10 DIAGNOSIS — C801 Malignant (primary) neoplasm, unspecified: Secondary | ICD-10-CM | POA: Diagnosis not present

## 2023-08-10 DIAGNOSIS — K604 Rectal fistula, unspecified: Secondary | ICD-10-CM | POA: Diagnosis not present

## 2023-08-10 DIAGNOSIS — R000 Tachycardia, unspecified: Secondary | ICD-10-CM | POA: Diagnosis not present

## 2023-08-10 DIAGNOSIS — Z743 Need for continuous supervision: Secondary | ICD-10-CM | POA: Diagnosis not present

## 2023-08-10 DIAGNOSIS — M899 Disorder of bone, unspecified: Secondary | ICD-10-CM | POA: Diagnosis not present

## 2023-08-10 DIAGNOSIS — I493 Ventricular premature depolarization: Secondary | ICD-10-CM | POA: Diagnosis not present

## 2023-08-10 DIAGNOSIS — R262 Difficulty in walking, not elsewhere classified: Secondary | ICD-10-CM | POA: Diagnosis not present

## 2023-08-10 DIAGNOSIS — K2081 Other esophagitis with bleeding: Secondary | ICD-10-CM | POA: Diagnosis not present

## 2023-08-10 DIAGNOSIS — D509 Iron deficiency anemia, unspecified: Secondary | ICD-10-CM | POA: Diagnosis not present

## 2023-08-10 DIAGNOSIS — J690 Pneumonitis due to inhalation of food and vomit: Secondary | ICD-10-CM | POA: Diagnosis not present

## 2023-08-10 DIAGNOSIS — D132 Benign neoplasm of duodenum: Secondary | ICD-10-CM | POA: Diagnosis not present

## 2023-08-10 DIAGNOSIS — C7989 Secondary malignant neoplasm of other specified sites: Secondary | ICD-10-CM | POA: Diagnosis not present

## 2023-08-10 DIAGNOSIS — Z681 Body mass index (BMI) 19 or less, adult: Secondary | ICD-10-CM | POA: Diagnosis not present

## 2023-08-10 DIAGNOSIS — Z4789 Encounter for other orthopedic aftercare: Secondary | ICD-10-CM | POA: Diagnosis not present

## 2023-08-10 DIAGNOSIS — M84652A Pathological fracture in other disease, left femur, initial encounter for fracture: Secondary | ICD-10-CM | POA: Diagnosis not present

## 2023-08-10 DIAGNOSIS — M84452A Pathological fracture, left femur, initial encounter for fracture: Secondary | ICD-10-CM | POA: Diagnosis not present

## 2023-08-10 DIAGNOSIS — G8918 Other acute postprocedural pain: Secondary | ICD-10-CM | POA: Diagnosis not present

## 2023-08-10 DIAGNOSIS — Z471 Aftercare following joint replacement surgery: Secondary | ICD-10-CM | POA: Diagnosis not present

## 2023-08-10 DIAGNOSIS — K920 Hematemesis: Secondary | ICD-10-CM | POA: Diagnosis not present

## 2023-08-10 DIAGNOSIS — N1371 Vesicoureteral-reflux without reflux nephropathy: Secondary | ICD-10-CM | POA: Diagnosis not present

## 2023-08-10 DIAGNOSIS — K2091 Esophagitis, unspecified with bleeding: Secondary | ICD-10-CM | POA: Diagnosis not present

## 2023-08-10 DIAGNOSIS — G9389 Other specified disorders of brain: Secondary | ICD-10-CM | POA: Diagnosis not present

## 2023-08-10 DIAGNOSIS — K317 Polyp of stomach and duodenum: Secondary | ICD-10-CM | POA: Diagnosis not present

## 2023-08-10 DIAGNOSIS — Z885 Allergy status to narcotic agent status: Secondary | ICD-10-CM | POA: Diagnosis not present

## 2023-08-10 DIAGNOSIS — Z880 Allergy status to penicillin: Secondary | ICD-10-CM | POA: Diagnosis not present

## 2023-08-10 DIAGNOSIS — I639 Cerebral infarction, unspecified: Secondary | ICD-10-CM | POA: Diagnosis not present

## 2023-08-10 DIAGNOSIS — J9811 Atelectasis: Secondary | ICD-10-CM | POA: Diagnosis not present

## 2023-08-10 DIAGNOSIS — Z89622 Acquired absence of left hip joint: Secondary | ICD-10-CM | POA: Diagnosis not present

## 2023-08-10 DIAGNOSIS — R911 Solitary pulmonary nodule: Secondary | ICD-10-CM | POA: Diagnosis not present

## 2023-08-10 DIAGNOSIS — M6281 Muscle weakness (generalized): Secondary | ICD-10-CM | POA: Diagnosis not present

## 2023-08-10 DIAGNOSIS — R9089 Other abnormal findings on diagnostic imaging of central nervous system: Secondary | ICD-10-CM | POA: Diagnosis not present

## 2023-08-10 DIAGNOSIS — C7951 Secondary malignant neoplasm of bone: Secondary | ICD-10-CM | POA: Diagnosis not present

## 2023-08-10 DIAGNOSIS — Z978 Presence of other specified devices: Secondary | ICD-10-CM | POA: Diagnosis not present

## 2023-08-10 DIAGNOSIS — R2242 Localized swelling, mass and lump, left lower limb: Secondary | ICD-10-CM | POA: Diagnosis not present

## 2023-08-10 DIAGNOSIS — C419 Malignant neoplasm of bone and articular cartilage, unspecified: Secondary | ICD-10-CM | POA: Diagnosis not present

## 2023-08-10 DIAGNOSIS — Z408 Encounter for other prophylactic surgery: Secondary | ICD-10-CM | POA: Diagnosis not present

## 2023-08-10 DIAGNOSIS — F17210 Nicotine dependence, cigarettes, uncomplicated: Secondary | ICD-10-CM | POA: Diagnosis not present

## 2023-08-10 DIAGNOSIS — R278 Other lack of coordination: Secondary | ICD-10-CM | POA: Diagnosis not present

## 2023-08-10 DIAGNOSIS — N2889 Other specified disorders of kidney and ureter: Secondary | ICD-10-CM | POA: Diagnosis not present

## 2023-08-10 DIAGNOSIS — E43 Unspecified severe protein-calorie malnutrition: Secondary | ICD-10-CM | POA: Diagnosis not present

## 2023-08-10 DIAGNOSIS — Z96642 Presence of left artificial hip joint: Secondary | ICD-10-CM | POA: Diagnosis not present

## 2023-08-11 DIAGNOSIS — C419 Malignant neoplasm of bone and articular cartilage, unspecified: Secondary | ICD-10-CM | POA: Diagnosis not present

## 2023-08-11 DIAGNOSIS — R2242 Localized swelling, mass and lump, left lower limb: Secondary | ICD-10-CM | POA: Diagnosis not present

## 2023-08-12 DIAGNOSIS — C419 Malignant neoplasm of bone and articular cartilage, unspecified: Secondary | ICD-10-CM | POA: Diagnosis not present

## 2023-08-12 DIAGNOSIS — R2242 Localized swelling, mass and lump, left lower limb: Secondary | ICD-10-CM | POA: Diagnosis not present

## 2023-08-13 DIAGNOSIS — C419 Malignant neoplasm of bone and articular cartilage, unspecified: Secondary | ICD-10-CM | POA: Diagnosis not present

## 2023-08-14 DIAGNOSIS — C419 Malignant neoplasm of bone and articular cartilage, unspecified: Secondary | ICD-10-CM | POA: Diagnosis not present

## 2023-08-18 DIAGNOSIS — G8918 Other acute postprocedural pain: Secondary | ICD-10-CM | POA: Diagnosis not present

## 2023-08-25 DIAGNOSIS — R319 Hematuria, unspecified: Secondary | ICD-10-CM | POA: Diagnosis not present

## 2023-08-25 DIAGNOSIS — N138 Other obstructive and reflux uropathy: Secondary | ICD-10-CM | POA: Diagnosis not present

## 2023-08-25 DIAGNOSIS — N320 Bladder-neck obstruction: Secondary | ICD-10-CM | POA: Diagnosis not present

## 2023-08-25 DIAGNOSIS — R52 Pain, unspecified: Secondary | ICD-10-CM | POA: Diagnosis not present

## 2023-08-25 DIAGNOSIS — E43 Unspecified severe protein-calorie malnutrition: Secondary | ICD-10-CM | POA: Diagnosis not present

## 2023-08-25 DIAGNOSIS — Z96642 Presence of left artificial hip joint: Secondary | ICD-10-CM | POA: Diagnosis not present

## 2023-08-25 DIAGNOSIS — D509 Iron deficiency anemia, unspecified: Secondary | ICD-10-CM | POA: Diagnosis not present

## 2023-08-25 DIAGNOSIS — I10 Essential (primary) hypertension: Secondary | ICD-10-CM | POA: Diagnosis not present

## 2023-08-25 DIAGNOSIS — C3490 Malignant neoplasm of unspecified part of unspecified bronchus or lung: Secondary | ICD-10-CM | POA: Diagnosis not present

## 2023-08-25 DIAGNOSIS — C801 Malignant (primary) neoplasm, unspecified: Secondary | ICD-10-CM | POA: Diagnosis not present

## 2023-08-25 DIAGNOSIS — I639 Cerebral infarction, unspecified: Secondary | ICD-10-CM | POA: Diagnosis not present

## 2023-08-25 DIAGNOSIS — K604 Rectal fistula, unspecified: Secondary | ICD-10-CM | POA: Diagnosis not present

## 2023-08-25 DIAGNOSIS — I2510 Atherosclerotic heart disease of native coronary artery without angina pectoris: Secondary | ICD-10-CM | POA: Diagnosis not present

## 2023-08-25 DIAGNOSIS — K2091 Esophagitis, unspecified with bleeding: Secondary | ICD-10-CM | POA: Diagnosis not present

## 2023-08-25 DIAGNOSIS — Z4789 Encounter for other orthopedic aftercare: Secondary | ICD-10-CM | POA: Diagnosis not present

## 2023-08-25 DIAGNOSIS — C419 Malignant neoplasm of bone and articular cartilage, unspecified: Secondary | ICD-10-CM | POA: Diagnosis not present

## 2023-08-25 DIAGNOSIS — D649 Anemia, unspecified: Secondary | ICD-10-CM | POA: Diagnosis not present

## 2023-08-25 DIAGNOSIS — R278 Other lack of coordination: Secondary | ICD-10-CM | POA: Diagnosis not present

## 2023-08-25 DIAGNOSIS — M25559 Pain in unspecified hip: Secondary | ICD-10-CM | POA: Diagnosis not present

## 2023-08-25 DIAGNOSIS — R262 Difficulty in walking, not elsewhere classified: Secondary | ICD-10-CM | POA: Diagnosis not present

## 2023-08-25 DIAGNOSIS — Z5181 Encounter for therapeutic drug level monitoring: Secondary | ICD-10-CM | POA: Diagnosis not present

## 2023-08-25 DIAGNOSIS — Z743 Need for continuous supervision: Secondary | ICD-10-CM | POA: Diagnosis not present

## 2023-08-25 DIAGNOSIS — R2242 Localized swelling, mass and lump, left lower limb: Secondary | ICD-10-CM | POA: Diagnosis not present

## 2023-08-25 DIAGNOSIS — Z23 Encounter for immunization: Secondary | ICD-10-CM | POA: Diagnosis not present

## 2023-08-25 DIAGNOSIS — M6281 Muscle weakness (generalized): Secondary | ICD-10-CM | POA: Diagnosis not present

## 2023-08-26 DIAGNOSIS — C419 Malignant neoplasm of bone and articular cartilage, unspecified: Secondary | ICD-10-CM | POA: Diagnosis not present

## 2023-08-31 DIAGNOSIS — C419 Malignant neoplasm of bone and articular cartilage, unspecified: Secondary | ICD-10-CM | POA: Diagnosis not present

## 2023-09-06 DIAGNOSIS — Z4789 Encounter for other orthopedic aftercare: Secondary | ICD-10-CM | POA: Diagnosis not present

## 2023-09-06 DIAGNOSIS — C419 Malignant neoplasm of bone and articular cartilage, unspecified: Secondary | ICD-10-CM | POA: Diagnosis not present

## 2023-09-07 DIAGNOSIS — Z5181 Encounter for therapeutic drug level monitoring: Secondary | ICD-10-CM | POA: Diagnosis not present

## 2023-09-07 DIAGNOSIS — D649 Anemia, unspecified: Secondary | ICD-10-CM | POA: Diagnosis not present

## 2023-09-07 DIAGNOSIS — I2510 Atherosclerotic heart disease of native coronary artery without angina pectoris: Secondary | ICD-10-CM | POA: Diagnosis not present

## 2023-09-13 DIAGNOSIS — R319 Hematuria, unspecified: Secondary | ICD-10-CM | POA: Diagnosis not present

## 2023-09-21 DIAGNOSIS — I639 Cerebral infarction, unspecified: Secondary | ICD-10-CM | POA: Diagnosis not present

## 2023-09-21 DIAGNOSIS — I10 Essential (primary) hypertension: Secondary | ICD-10-CM | POA: Diagnosis not present

## 2023-09-21 DIAGNOSIS — Z5181 Encounter for therapeutic drug level monitoring: Secondary | ICD-10-CM | POA: Diagnosis not present

## 2023-09-21 DIAGNOSIS — R52 Pain, unspecified: Secondary | ICD-10-CM | POA: Diagnosis not present

## 2023-10-10 DIAGNOSIS — R6889 Other general symptoms and signs: Secondary | ICD-10-CM | POA: Diagnosis not present

## 2023-10-10 DIAGNOSIS — T8149XA Infection following a procedure, other surgical site, initial encounter: Secondary | ICD-10-CM | POA: Diagnosis not present

## 2023-10-10 DIAGNOSIS — K8020 Calculus of gallbladder without cholecystitis without obstruction: Secondary | ICD-10-CM | POA: Diagnosis not present

## 2023-10-10 DIAGNOSIS — N1330 Unspecified hydronephrosis: Secondary | ICD-10-CM | POA: Diagnosis not present

## 2023-10-11 DIAGNOSIS — Z681 Body mass index (BMI) 19 or less, adult: Secondary | ICD-10-CM | POA: Diagnosis not present

## 2023-10-11 DIAGNOSIS — K8020 Calculus of gallbladder without cholecystitis without obstruction: Secondary | ICD-10-CM | POA: Diagnosis not present

## 2023-10-11 DIAGNOSIS — N179 Acute kidney failure, unspecified: Secondary | ICD-10-CM | POA: Diagnosis not present

## 2023-10-11 DIAGNOSIS — R0902 Hypoxemia: Secondary | ICD-10-CM | POA: Diagnosis not present

## 2023-10-11 DIAGNOSIS — C7951 Secondary malignant neoplasm of bone: Secondary | ICD-10-CM | POA: Diagnosis not present

## 2023-10-11 DIAGNOSIS — R55 Syncope and collapse: Secondary | ICD-10-CM | POA: Diagnosis not present

## 2023-10-11 DIAGNOSIS — E43 Unspecified severe protein-calorie malnutrition: Secondary | ICD-10-CM | POA: Diagnosis not present

## 2023-10-11 DIAGNOSIS — R64 Cachexia: Secondary | ICD-10-CM | POA: Diagnosis not present

## 2023-10-11 DIAGNOSIS — Z79899 Other long term (current) drug therapy: Secondary | ICD-10-CM | POA: Diagnosis not present

## 2023-10-11 DIAGNOSIS — T83511A Infection and inflammatory reaction due to indwelling urethral catheter, initial encounter: Secondary | ICD-10-CM | POA: Diagnosis not present

## 2023-10-11 DIAGNOSIS — N1330 Unspecified hydronephrosis: Secondary | ICD-10-CM | POA: Diagnosis not present

## 2023-10-11 DIAGNOSIS — R296 Repeated falls: Secondary | ICD-10-CM | POA: Diagnosis not present

## 2023-10-11 DIAGNOSIS — Z885 Allergy status to narcotic agent status: Secondary | ICD-10-CM | POA: Diagnosis not present

## 2023-10-11 DIAGNOSIS — R918 Other nonspecific abnormal finding of lung field: Secondary | ICD-10-CM | POA: Diagnosis not present

## 2023-10-11 DIAGNOSIS — E222 Syndrome of inappropriate secretion of antidiuretic hormone: Secondary | ICD-10-CM | POA: Diagnosis not present

## 2023-10-11 DIAGNOSIS — E8721 Acute metabolic acidosis: Secondary | ICD-10-CM | POA: Diagnosis not present

## 2023-10-11 DIAGNOSIS — D692 Other nonthrombocytopenic purpura: Secondary | ICD-10-CM | POA: Diagnosis not present

## 2023-10-11 DIAGNOSIS — Z880 Allergy status to penicillin: Secondary | ICD-10-CM | POA: Diagnosis not present

## 2023-10-11 DIAGNOSIS — D649 Anemia, unspecified: Secondary | ICD-10-CM | POA: Diagnosis not present

## 2023-10-11 DIAGNOSIS — F17210 Nicotine dependence, cigarettes, uncomplicated: Secondary | ICD-10-CM | POA: Diagnosis not present

## 2023-10-11 DIAGNOSIS — M898X9 Other specified disorders of bone, unspecified site: Secondary | ICD-10-CM | POA: Diagnosis not present

## 2023-10-11 DIAGNOSIS — Z452 Encounter for adjustment and management of vascular access device: Secondary | ICD-10-CM | POA: Diagnosis not present

## 2023-10-11 DIAGNOSIS — M1990 Unspecified osteoarthritis, unspecified site: Secondary | ICD-10-CM | POA: Diagnosis not present

## 2023-10-11 DIAGNOSIS — Z7401 Bed confinement status: Secondary | ICD-10-CM | POA: Diagnosis not present

## 2023-10-11 DIAGNOSIS — I700 Atherosclerosis of aorta: Secondary | ICD-10-CM | POA: Diagnosis not present

## 2023-10-11 DIAGNOSIS — N320 Bladder-neck obstruction: Secondary | ICD-10-CM | POA: Diagnosis not present

## 2023-10-11 DIAGNOSIS — E875 Hyperkalemia: Secondary | ICD-10-CM | POA: Diagnosis not present

## 2023-10-11 DIAGNOSIS — C3490 Malignant neoplasm of unspecified part of unspecified bronchus or lung: Secondary | ICD-10-CM | POA: Diagnosis not present

## 2023-10-11 DIAGNOSIS — E871 Hypo-osmolality and hyponatremia: Secondary | ICD-10-CM | POA: Diagnosis not present

## 2023-10-11 DIAGNOSIS — R531 Weakness: Secondary | ICD-10-CM | POA: Diagnosis not present

## 2023-10-11 DIAGNOSIS — Z7409 Other reduced mobility: Secondary | ICD-10-CM | POA: Diagnosis not present

## 2023-10-11 DIAGNOSIS — Z96642 Presence of left artificial hip joint: Secondary | ICD-10-CM | POA: Diagnosis not present

## 2023-10-11 DIAGNOSIS — B952 Enterococcus as the cause of diseases classified elsewhere: Secondary | ICD-10-CM | POA: Diagnosis not present

## 2023-10-11 DIAGNOSIS — M6259 Muscle wasting and atrophy, not elsewhere classified, multiple sites: Secondary | ICD-10-CM | POA: Diagnosis not present

## 2023-10-11 DIAGNOSIS — Z79891 Long term (current) use of opiate analgesic: Secondary | ICD-10-CM | POA: Diagnosis not present

## 2023-10-11 DIAGNOSIS — B9689 Other specified bacterial agents as the cause of diseases classified elsewhere: Secondary | ICD-10-CM | POA: Diagnosis not present

## 2023-10-11 DIAGNOSIS — M25552 Pain in left hip: Secondary | ICD-10-CM | POA: Diagnosis not present

## 2023-10-11 DIAGNOSIS — R030 Elevated blood-pressure reading, without diagnosis of hypertension: Secondary | ICD-10-CM | POA: Diagnosis not present

## 2023-10-11 DIAGNOSIS — R262 Difficulty in walking, not elsewhere classified: Secondary | ICD-10-CM | POA: Diagnosis not present

## 2023-10-11 DIAGNOSIS — N390 Urinary tract infection, site not specified: Secondary | ICD-10-CM | POA: Diagnosis not present

## 2023-10-11 DIAGNOSIS — N139 Obstructive and reflux uropathy, unspecified: Secondary | ICD-10-CM | POA: Diagnosis not present

## 2023-10-12 DIAGNOSIS — N179 Acute kidney failure, unspecified: Secondary | ICD-10-CM | POA: Diagnosis not present

## 2023-10-12 DIAGNOSIS — N139 Obstructive and reflux uropathy, unspecified: Secondary | ICD-10-CM | POA: Diagnosis not present

## 2023-10-12 DIAGNOSIS — E8721 Acute metabolic acidosis: Secondary | ICD-10-CM | POA: Diagnosis not present

## 2023-10-13 DIAGNOSIS — E8721 Acute metabolic acidosis: Secondary | ICD-10-CM | POA: Diagnosis not present

## 2023-10-13 DIAGNOSIS — R0902 Hypoxemia: Secondary | ICD-10-CM | POA: Diagnosis not present

## 2023-10-13 DIAGNOSIS — R918 Other nonspecific abnormal finding of lung field: Secondary | ICD-10-CM | POA: Diagnosis not present

## 2023-10-13 DIAGNOSIS — Z452 Encounter for adjustment and management of vascular access device: Secondary | ICD-10-CM | POA: Diagnosis not present

## 2023-10-13 DIAGNOSIS — N139 Obstructive and reflux uropathy, unspecified: Secondary | ICD-10-CM | POA: Diagnosis not present

## 2023-10-13 DIAGNOSIS — N179 Acute kidney failure, unspecified: Secondary | ICD-10-CM | POA: Diagnosis not present

## 2023-10-13 DIAGNOSIS — I700 Atherosclerosis of aorta: Secondary | ICD-10-CM | POA: Diagnosis not present

## 2023-10-18 DIAGNOSIS — G8918 Other acute postprocedural pain: Secondary | ICD-10-CM | POA: Diagnosis not present

## 2023-10-18 DIAGNOSIS — Z789 Other specified health status: Secondary | ICD-10-CM | POA: Diagnosis not present

## 2023-10-18 DIAGNOSIS — C3490 Malignant neoplasm of unspecified part of unspecified bronchus or lung: Secondary | ICD-10-CM | POA: Diagnosis not present

## 2023-10-18 DIAGNOSIS — N179 Acute kidney failure, unspecified: Secondary | ICD-10-CM | POA: Diagnosis not present

## 2023-10-18 DIAGNOSIS — M79652 Pain in left thigh: Secondary | ICD-10-CM | POA: Diagnosis not present

## 2023-10-18 DIAGNOSIS — R55 Syncope and collapse: Secondary | ICD-10-CM | POA: Diagnosis not present

## 2023-10-18 DIAGNOSIS — C7951 Secondary malignant neoplasm of bone: Secondary | ICD-10-CM | POA: Diagnosis not present

## 2023-10-18 DIAGNOSIS — F17210 Nicotine dependence, cigarettes, uncomplicated: Secondary | ICD-10-CM | POA: Diagnosis not present

## 2023-10-18 DIAGNOSIS — I959 Hypotension, unspecified: Secondary | ICD-10-CM | POA: Diagnosis not present

## 2023-10-18 DIAGNOSIS — R296 Repeated falls: Secondary | ICD-10-CM | POA: Diagnosis not present

## 2023-10-18 DIAGNOSIS — R262 Difficulty in walking, not elsewhere classified: Secondary | ICD-10-CM | POA: Diagnosis not present

## 2023-10-18 DIAGNOSIS — E871 Hypo-osmolality and hyponatremia: Secondary | ICD-10-CM | POA: Diagnosis not present

## 2023-10-18 DIAGNOSIS — M1990 Unspecified osteoarthritis, unspecified site: Secondary | ICD-10-CM | POA: Diagnosis not present

## 2023-10-18 DIAGNOSIS — Z96642 Presence of left artificial hip joint: Secondary | ICD-10-CM | POA: Diagnosis not present

## 2023-10-18 DIAGNOSIS — Z743 Need for continuous supervision: Secondary | ICD-10-CM | POA: Diagnosis not present

## 2023-10-18 DIAGNOSIS — T83511A Infection and inflammatory reaction due to indwelling urethral catheter, initial encounter: Secondary | ICD-10-CM | POA: Diagnosis not present

## 2023-10-18 DIAGNOSIS — E222 Syndrome of inappropriate secretion of antidiuretic hormone: Secondary | ICD-10-CM | POA: Diagnosis not present

## 2023-10-18 DIAGNOSIS — R030 Elevated blood-pressure reading, without diagnosis of hypertension: Secondary | ICD-10-CM | POA: Diagnosis not present

## 2023-10-18 DIAGNOSIS — D692 Other nonthrombocytopenic purpura: Secondary | ICD-10-CM | POA: Diagnosis not present

## 2023-10-18 DIAGNOSIS — E43 Unspecified severe protein-calorie malnutrition: Secondary | ICD-10-CM | POA: Diagnosis not present

## 2023-10-18 DIAGNOSIS — Z7409 Other reduced mobility: Secondary | ICD-10-CM | POA: Diagnosis not present

## 2023-10-18 DIAGNOSIS — R0902 Hypoxemia: Secondary | ICD-10-CM | POA: Diagnosis not present

## 2023-10-18 DIAGNOSIS — I700 Atherosclerosis of aorta: Secondary | ICD-10-CM | POA: Diagnosis not present

## 2023-10-18 DIAGNOSIS — N320 Bladder-neck obstruction: Secondary | ICD-10-CM | POA: Diagnosis not present

## 2023-10-18 DIAGNOSIS — D649 Anemia, unspecified: Secondary | ICD-10-CM | POA: Diagnosis not present

## 2023-10-18 DIAGNOSIS — R6889 Other general symptoms and signs: Secondary | ICD-10-CM | POA: Diagnosis not present

## 2023-10-18 DIAGNOSIS — R404 Transient alteration of awareness: Secondary | ICD-10-CM | POA: Diagnosis not present

## 2023-10-18 DIAGNOSIS — M6259 Muscle wasting and atrophy, not elsewhere classified, multiple sites: Secondary | ICD-10-CM | POA: Diagnosis not present

## 2023-10-18 DIAGNOSIS — R531 Weakness: Secondary | ICD-10-CM | POA: Diagnosis not present

## 2023-10-18 DIAGNOSIS — R64 Cachexia: Secondary | ICD-10-CM | POA: Diagnosis not present

## 2023-10-18 DIAGNOSIS — M25552 Pain in left hip: Secondary | ICD-10-CM | POA: Diagnosis not present

## 2023-10-18 DIAGNOSIS — N139 Obstructive and reflux uropathy, unspecified: Secondary | ICD-10-CM | POA: Diagnosis not present

## 2023-10-18 DIAGNOSIS — Z7401 Bed confinement status: Secondary | ICD-10-CM | POA: Diagnosis not present

## 2023-10-18 DIAGNOSIS — N390 Urinary tract infection, site not specified: Secondary | ICD-10-CM | POA: Diagnosis not present

## 2023-10-19 DIAGNOSIS — M25552 Pain in left hip: Secondary | ICD-10-CM | POA: Diagnosis not present

## 2023-10-19 DIAGNOSIS — Z96642 Presence of left artificial hip joint: Secondary | ICD-10-CM | POA: Diagnosis not present

## 2023-10-19 DIAGNOSIS — C7951 Secondary malignant neoplasm of bone: Secondary | ICD-10-CM | POA: Diagnosis not present

## 2023-10-19 DIAGNOSIS — R262 Difficulty in walking, not elsewhere classified: Secondary | ICD-10-CM | POA: Diagnosis not present

## 2023-10-20 DIAGNOSIS — R55 Syncope and collapse: Secondary | ICD-10-CM | POA: Diagnosis not present

## 2023-10-20 DIAGNOSIS — D649 Anemia, unspecified: Secondary | ICD-10-CM | POA: Diagnosis not present

## 2023-10-20 DIAGNOSIS — I959 Hypotension, unspecified: Secondary | ICD-10-CM | POA: Diagnosis not present

## 2023-10-20 DIAGNOSIS — M1990 Unspecified osteoarthritis, unspecified site: Secondary | ICD-10-CM | POA: Diagnosis not present

## 2023-10-24 DIAGNOSIS — R262 Difficulty in walking, not elsewhere classified: Secondary | ICD-10-CM | POA: Diagnosis not present

## 2023-10-24 DIAGNOSIS — R64 Cachexia: Secondary | ICD-10-CM | POA: Diagnosis not present

## 2023-10-24 DIAGNOSIS — C7951 Secondary malignant neoplasm of bone: Secondary | ICD-10-CM | POA: Diagnosis not present

## 2023-10-24 DIAGNOSIS — E222 Syndrome of inappropriate secretion of antidiuretic hormone: Secondary | ICD-10-CM | POA: Diagnosis not present

## 2023-10-25 DIAGNOSIS — C7951 Secondary malignant neoplasm of bone: Secondary | ICD-10-CM | POA: Diagnosis not present

## 2023-10-25 DIAGNOSIS — Z96642 Presence of left artificial hip joint: Secondary | ICD-10-CM | POA: Diagnosis not present

## 2023-10-26 DIAGNOSIS — Z789 Other specified health status: Secondary | ICD-10-CM | POA: Diagnosis not present

## 2023-10-26 DIAGNOSIS — R262 Difficulty in walking, not elsewhere classified: Secondary | ICD-10-CM | POA: Diagnosis not present

## 2023-10-31 DIAGNOSIS — N390 Urinary tract infection, site not specified: Secondary | ICD-10-CM | POA: Diagnosis not present

## 2023-10-31 DIAGNOSIS — C3490 Malignant neoplasm of unspecified part of unspecified bronchus or lung: Secondary | ICD-10-CM | POA: Diagnosis not present

## 2023-10-31 DIAGNOSIS — M79652 Pain in left thigh: Secondary | ICD-10-CM | POA: Diagnosis not present

## 2023-10-31 DIAGNOSIS — T83511A Infection and inflammatory reaction due to indwelling urethral catheter, initial encounter: Secondary | ICD-10-CM | POA: Diagnosis not present

## 2023-10-31 DIAGNOSIS — E43 Unspecified severe protein-calorie malnutrition: Secondary | ICD-10-CM | POA: Diagnosis not present

## 2023-10-31 DIAGNOSIS — I959 Hypotension, unspecified: Secondary | ICD-10-CM | POA: Diagnosis not present

## 2023-10-31 DIAGNOSIS — Z96642 Presence of left artificial hip joint: Secondary | ICD-10-CM | POA: Diagnosis not present

## 2023-10-31 DIAGNOSIS — C7951 Secondary malignant neoplasm of bone: Secondary | ICD-10-CM | POA: Diagnosis not present

## 2023-11-03 ENCOUNTER — Other Ambulatory Visit: Payer: 59

## 2023-11-03 ENCOUNTER — Ambulatory Visit: Payer: 59 | Admitting: Oncology

## 2023-11-04 ENCOUNTER — Inpatient Hospital Stay: Payer: 59 | Admitting: Oncology

## 2023-11-04 ENCOUNTER — Inpatient Hospital Stay: Payer: 59

## 2023-11-04 NOTE — Progress Notes (Deleted)
[facility]  [address] [place],  [place]  [address] [phone]  Clinic Day:  11/04/2023  Referring physician: [facility]   HISTORY OF PRESENT ILLNESS:  The patient is a 67 y.o. male  *** who I was asked to consult upon for ***   PAST MEDICAL HISTORY:  No past medical history on file.  PAST SURGICAL HISTORY:   Past Surgical History:  Procedure Laterality Date  . TEE WITHOUT CARDIOVERSION N/A 11/03/2018   Procedure: TRANSESOPHAGEAL ECHOCARDIOGRAM (TEE);  Surgeon: Quintella Reichert, MD;  Location: [facility] ENDOSCOPY;  Service: Cardiovascular;  Laterality: N/A;    CURRENT MEDICATIONS:   Current Outpatient Medications  Medication Sig Dispense Refill  . aspirin 325 MG tablet Take 1 tablet (325 mg total) by mouth daily.     No current facility-administered medications for this visit.    ALLERGIES:   Allergies  Allergen Reactions  . Penicillins Rash    FAMILY HISTORY:  No family history on file.  SOCIAL HISTORY:   has no history on file for tobacco use, alcohol use, and drug use.  REVIEW OF SYSTEMS:  Review of Systems - Oncology   PHYSICAL EXAM:  There were no vitals taken for this visit. Wt Readings from Last 3 Encounters:  11/03/18 141 lb 1.5 oz (64 kg)   There is no height or weight on file to calculate BMI. Performance status (ECOG): {CHL ONC [id number] Physical Exam  PATHOLOGY: A. 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              - Tumor extends focally to resection margin at the level of greater trochanter.    Electronically signed by Champ Mungo, MD on 09/03/2023 at 11:09 AM     LABS:      Latest Ref Rng & Units 11/03/2018    3:53 AM 11/02/2018    5:39 AM 11/01/2018    4:11 AM  CBC  WBC 4.0 - 10.5 K/uL 9.8  9.9  10.3   Hemoglobin 13.0 -  17.0 g/dL 69.6  29.5  28.4   Hematocrit 39.0 - 52.0 % 44.5  44.3  39.8   Platelets 150 - 400 K/uL 284  250  200       Latest Ref Rng & Units 11/03/2018    3:53 AM 11/02/2018    5:39 AM 11/01/2018    4:11 AM  CMP  Glucose 70 - 99 mg/dL 84  98  85   BUN 8 - 23 mg/dL 8  5  8    Creatinine 0.61 - 1.24 mg/dL 1.32  4.40  1.02   Sodium 135 - 145 mmol/L 140  139  141   Potassium 3.5 - 5.1 mmol/L 3.0  3.6  3.1   Chloride 98 - 111 mmol/L 105  107  109   CO2 22 - 32 mmol/L 27  25  26    Calcium 8.9 - 10.3 mg/dL 8.7  8.6  8.4   Total Protein 6.5 - 8.1 g/dL 6.1  6.0  5.8   Total Bilirubin 0.3 - 1.2 mg/dL 0.4  1.0  0.6   Alkaline Phos 38 - 126 U/L 52  58  53   AST 15 - 41 U/L 247  338  322   ALT 0 - 44 U/L 102  111  96  No results found for: "CEA1", "CEA" / No results found for: "CEA1", "CEA" No results found for: "PSA1" No results found for: "YNW295" No results found for: "CAN125"  No results found for: "TOTALPROTELP", "ALBUMINELP", "A1GS", "A2GS", "BETS", "BETA2SER", "GAMS", "MSPIKE", "SPEI" No results found for: "TIBC", "FERRITIN", "IRONPCTSAT" No results found for: "LDH"  No results found for: "AFPTUMOR", "TOTALPROTELP", "ALBUMINELP", "A1GS", "A2GS", "BETS", "BETA2SER", "GAMS", "MSPIKE", "SPEI", "LDH", "CEA1", "CEA", "PSA1", "IGASERUM", "IGGSERUM", "IGMSERUM", "THGAB", "THYROGLB"  Review Flowsheet        No data to display          STUDIES:  No results found.   ASSESSMENT & PLAN:  A 67 y.o. male who I was asked to consult upon for *** .The patient understands all the plans discussed today and is in agreement with them.  I do appreciate [facility] for his new consult.   Alannah Averhart Kirby Funk, MD
